# Patient Record
Sex: Female | Born: 1950 | Race: White | Hispanic: No | Smoking: Never smoker
Health system: Southern US, Community
[De-identification: ages and names within clinical notes are randomized; demographics above are authoritative.]

## PROBLEM LIST (undated history)

## (undated) DIAGNOSIS — IMO0001 Reserved for inherently not codable concepts without codable children: Secondary | ICD-10-CM

## (undated) DIAGNOSIS — K279 Peptic ulcer, site unspecified, unspecified as acute or chronic, without hemorrhage or perforation: Secondary | ICD-10-CM

## (undated) DIAGNOSIS — N289 Disorder of kidney and ureter, unspecified: Secondary | ICD-10-CM

## (undated) DIAGNOSIS — A048 Other specified bacterial intestinal infections: Secondary | ICD-10-CM

## (undated) DIAGNOSIS — M1990 Unspecified osteoarthritis, unspecified site: Secondary | ICD-10-CM

## (undated) DIAGNOSIS — K8020 Calculus of gallbladder without cholecystitis without obstruction: Secondary | ICD-10-CM

## (undated) DIAGNOSIS — K219 Gastro-esophageal reflux disease without esophagitis: Secondary | ICD-10-CM

## (undated) HISTORY — PX: LITHOTRIPSY: SUR834

---

## 2005-04-30 ENCOUNTER — Ambulatory Visit (HOSPITAL_COMMUNITY): Admission: RE | Admit: 2005-04-30 | Discharge: 2005-04-30 | Payer: Self-pay | Admitting: Family Medicine

## 2011-07-30 ENCOUNTER — Encounter (HOSPITAL_COMMUNITY): Payer: Self-pay | Admitting: *Deleted

## 2011-07-30 ENCOUNTER — Emergency Department (HOSPITAL_COMMUNITY): Payer: Self-pay

## 2011-07-30 ENCOUNTER — Emergency Department (HOSPITAL_COMMUNITY)
Admission: EM | Admit: 2011-07-30 | Discharge: 2011-07-31 | Disposition: A | Discharge status: Home / Self Care | Payer: Self-pay | Attending: Emergency Medicine | Admitting: Emergency Medicine

## 2011-07-30 DIAGNOSIS — N200 Calculus of kidney: Secondary | ICD-10-CM | POA: Insufficient documentation

## 2011-07-30 DIAGNOSIS — R109 Unspecified abdominal pain: Secondary | ICD-10-CM | POA: Insufficient documentation

## 2011-07-30 DIAGNOSIS — K279 Peptic ulcer, site unspecified, unspecified as acute or chronic, without hemorrhage or perforation: Secondary | ICD-10-CM | POA: Insufficient documentation

## 2011-07-30 DIAGNOSIS — K219 Gastro-esophageal reflux disease without esophagitis: Secondary | ICD-10-CM | POA: Insufficient documentation

## 2011-07-30 HISTORY — DX: Calculus of gallbladder without cholecystitis without obstruction: K80.20

## 2011-07-30 HISTORY — DX: Peptic ulcer, site unspecified, unspecified as acute or chronic, without hemorrhage or perforation: K27.9

## 2011-07-30 HISTORY — DX: Reserved for inherently not codable concepts without codable children: IMO0001

## 2011-07-30 HISTORY — DX: Gastro-esophageal reflux disease without esophagitis: K21.9

## 2011-07-30 LAB — CBC
MCV: 87.4 fL (ref 78.0–100.0)
RDW: 13.5 % (ref 11.5–15.5)
WBC: 9.2 10*3/uL (ref 4.0–10.5)

## 2011-07-30 LAB — COMPREHENSIVE METABOLIC PANEL
AST: 19 U/L (ref 0–37)
Albumin: 3.7 g/dL (ref 3.5–5.2)
Alkaline Phosphatase: 89 U/L (ref 39–117)
CO2: 24 mEq/L (ref 19–32)
Chloride: 101 mEq/L (ref 96–112)
Creatinine, Ser: 1.02 mg/dL (ref 0.50–1.10)
GFR calc Af Amer: 68 mL/min — ABNORMAL LOW (ref >90)
GFR calc non Af Amer: 59 mL/min — ABNORMAL LOW (ref >90)
Glucose, Bld: 172 mg/dL — ABNORMAL HIGH (ref 70–99)
Potassium: 3.5 mEq/L (ref 3.5–5.1)
Sodium: 136 mEq/L (ref 135–145)

## 2011-07-30 LAB — DIFFERENTIAL
Basophils Relative: 0 % (ref 0–1)
Lymphocytes Relative: 17 % (ref 12–46)
Lymphs Abs: 1.6 10*3/uL (ref 0.7–4.0)
Monocytes Relative: 4 % (ref 3–12)
Neutrophils Relative %: 78 % — ABNORMAL HIGH (ref 43–77)

## 2011-07-30 MED ORDER — KETOROLAC TROMETHAMINE 30 MG/ML IJ SOLN
30.0000 mg | Freq: Once | INTRAMUSCULAR | Status: AC
  Administered 2011-07-30: 30 mg via INTRAVENOUS
  Filled 2011-07-30: qty 1

## 2011-07-30 MED ORDER — ONDANSETRON HCL 4 MG/2ML IJ SOLN
4.0000 mg | Freq: Once | INTRAMUSCULAR | Status: AC
  Administered 2011-07-30: 4 mg via INTRAVENOUS
  Filled 2011-07-30: qty 2

## 2011-07-30 MED ORDER — SODIUM CHLORIDE 0.9 % IV BOLUS (SEPSIS)
1000.0000 mL | Freq: Once | INTRAVENOUS | Status: AC
  Administered 2011-07-30: 1000 mL via INTRAVENOUS

## 2011-07-30 MED ORDER — HYDROMORPHONE HCL PF 1 MG/ML IJ SOLN
1.0000 mg | Freq: Once | INTRAMUSCULAR | Status: AC
  Administered 2011-07-30: 1 mg via INTRAVENOUS
  Filled 2011-07-30: qty 1

## 2011-07-30 NOTE — ED Notes (Signed)
Flank pain, vomiting

## 2011-07-30 NOTE — ED Provider Notes (Signed)
History     CSN: [id number]  Arrival date & time 07/30/11  2233   First MD Initiated Contact with Patient 07/30/11 2301      Chief Complaint  Patient presents with  . Flank Pain   HPI The patient presents with left sided back pain.  The pain began acutely 5 hours prior to arrival.  Since onset the pain is constant, sharp, crampy.  The patient's complaint of nausea and emesis.  She denies any diarrhea or dysuria.  She also denies fevers, chills.  She notes that she was in her usual state of health prior to the onset of symptoms.  No relief with OTC medication. Past Medical History  Diagnosis Date  . Reflux   . Peptic ulcer   . Gallstones     History reviewed. No pertinent past surgical history.  History reviewed. No pertinent family history.  History  Substance Use Topics  . Smoking status: Never Smoker   . Smokeless tobacco: Not on file  . Alcohol Use: No    OB History    Grav Para Term Preterm Abortions TAB SAB Ect Mult Living                  Review of Systems  Constitutional:       HPI  HENT:       HPI otherwise negative  Eyes: Negative.   Respiratory:       HPI, otherwise negative  Cardiovascular:       HPI, otherwise nmegative  Gastrointestinal: Positive for nausea and vomiting. Negative for diarrhea and constipation.  Genitourinary:       HPI, otherwise negative  Musculoskeletal:       HPI, otherwise negative  Skin: Negative.   Neurological: Negative for syncope.    Allergies  Avelox  Home Medications   Current Outpatient Rx  Name Route Sig Dispense Refill  . DEXLANSOPRAZOLE 30 MG PO CPDR Oral Take 30 mg by mouth at bedtime.    Marland Kitchen VICOPROFEN PO Oral Take 1 tablet by mouth as needed. For headaches      BP 122/91  Pulse 86  Resp 22  Ht 5\' 2"  (1.575 m)  Wt 250 lb (113.399 kg)  BMI 45.73 kg/m2  SpO2 99%  Physical Exam  Nursing note and vitals reviewed. Constitutional: She is oriented to person, place, and time. She appears well-developed  and well-nourished. No distress.  HENT:  Head: Normocephalic and atraumatic.  Eyes: Conjunctivae and EOM are normal.  Cardiovascular: Normal rate and regular rhythm.   Pulmonary/Chest: Effort normal and breath sounds normal. No stridor. No respiratory distress.  Abdominal: She exhibits no distension. There is no hepatosplenomegaly or hepatomegaly. There is tenderness in the left upper quadrant. There is CVA tenderness. There is no rigidity, no rebound and no guarding.  Musculoskeletal: She exhibits no edema.  Neurological: She is alert and oriented to person, place, and time. No cranial nerve deficit.  Skin: Skin is warm and dry.  Psychiatric: She has a normal mood and affect.    ED Course  Procedures (including critical care time)   Labs Reviewed  CBC  DIFFERENTIAL  COMPREHENSIVE METABOLIC PANEL  LIPASE, BLOOD  URINALYSIS, ROUTINE W REFLEX MICROSCOPIC   No results found.   No diagnosis found.   1:12 AM Patient resting (significantly more comfortable)   MDM  This female patient presents with acute onset of left sided flank pain and nausea with vomiting.  On exam the patient is initially uncomfortable appearing.  The  patient's presentation was most concerning for nephrolithiasis versus pyelonephritis.  Patient's vital signs are stable.  Patient's labs are notable for the absence of leukocytosis. The CT does demonstrate left sided kidney stone with partial obstruction.  Following provision of IV fluids, analgesics, the patient was significantly more comfortable.  Absent fever, leukocytosis the patient was discharged in stable condition to follow up with urology in the morning, 6 hours from now.  She was provided return precautions.  Gerhard Munch, MD 07/31/11 [phone]

## 2011-07-31 ENCOUNTER — Other Ambulatory Visit: Payer: Self-pay | Admitting: Urology

## 2011-07-31 ENCOUNTER — Ambulatory Visit (HOSPITAL_COMMUNITY): Payer: Self-pay

## 2011-07-31 ENCOUNTER — Encounter (HOSPITAL_COMMUNITY): Payer: Self-pay | Admitting: *Deleted

## 2011-07-31 ENCOUNTER — Encounter (HOSPITAL_COMMUNITY): Payer: Self-pay | Admitting: Anesthesiology

## 2011-07-31 ENCOUNTER — Ambulatory Visit (HOSPITAL_COMMUNITY)
Admission: AD | Admit: 2011-07-31 | Discharge: 2011-07-31 | Disposition: A | Discharge status: Home / Self Care | Payer: Self-pay | Source: Ambulatory Visit | Attending: Urology | Admitting: Urology

## 2011-07-31 ENCOUNTER — Ambulatory Visit (HOSPITAL_COMMUNITY): Payer: Self-pay | Admitting: Anesthesiology

## 2011-07-31 ENCOUNTER — Encounter (HOSPITAL_COMMUNITY)
Admission: AD | Disposition: A | Discharge status: Home / Self Care | Payer: Self-pay | Source: Ambulatory Visit | Attending: Urology

## 2011-07-31 DIAGNOSIS — N1330 Unspecified hydronephrosis: Secondary | ICD-10-CM | POA: Insufficient documentation

## 2011-07-31 DIAGNOSIS — N201 Calculus of ureter: Secondary | ICD-10-CM | POA: Insufficient documentation

## 2011-07-31 DIAGNOSIS — E7800 Pure hypercholesterolemia, unspecified: Secondary | ICD-10-CM | POA: Insufficient documentation

## 2011-07-31 HISTORY — DX: Unspecified osteoarthritis, unspecified site: M19.90

## 2011-07-31 LAB — SURGICAL PCR SCREEN: Staphylococcus aureus: NEGATIVE

## 2011-07-31 SURGERY — CYSTOURETEROSCOPY, WITH RETROGRADE PYELOGRAM AND STENT INSERTION
Anesthesia: General | Site: Ureter | Laterality: Left | Wound class: Clean Contaminated

## 2011-07-31 MED ORDER — INDIGOTINDISULFONATE SODIUM 8 MG/ML IJ SOLN
INTRAMUSCULAR | Status: AC
  Filled 2011-07-31: qty 5

## 2011-07-31 MED ORDER — MIDAZOLAM HCL 5 MG/5ML IJ SOLN
INTRAMUSCULAR | Status: DC | PRN
  Administered 2011-07-31: 2 mg via INTRAVENOUS

## 2011-07-31 MED ORDER — PROPOFOL 10 MG/ML IV EMUL
INTRAVENOUS | Status: DC | PRN
  Administered 2011-07-31: 160 mg via INTRAVENOUS

## 2011-07-31 MED ORDER — LIDOCAINE HCL 2 % EX GEL
CUTANEOUS | Status: AC
  Filled 2011-07-31: qty 10

## 2011-07-31 MED ORDER — IOHEXOL 300 MG/ML  SOLN
INTRAMUSCULAR | Status: AC
  Filled 2011-07-31: qty 1

## 2011-07-31 MED ORDER — LACTATED RINGERS IV SOLN: INTRAVENOUS | Status: DC

## 2011-07-31 MED ORDER — IOHEXOL 300 MG/ML  SOLN
INTRAMUSCULAR | Status: DC | PRN
  Administered 2011-07-31: 9 mL

## 2011-07-31 MED ORDER — BELLADONNA ALKALOIDS-OPIUM 16.2-60 MG RE SUPP
RECTAL | Status: AC
  Filled 2011-07-31: qty 1

## 2011-07-31 MED ORDER — CEFAZOLIN SODIUM 1-5 GM-% IV SOLN
INTRAVENOUS | Status: AC
  Filled 2011-07-31: qty 50

## 2011-07-31 MED ORDER — CEFAZOLIN SODIUM 1-5 GM-% IV SOLN
1.0000 g | INTRAVENOUS | Status: AC
  Administered 2011-07-31: 1 g via INTRAVENOUS

## 2011-07-31 MED ORDER — HYDROMORPHONE HCL PF 1 MG/ML IJ SOLN: 0.2500 mg | INTRAMUSCULAR | Status: DC | PRN

## 2011-07-31 MED ORDER — LACTATED RINGERS IV SOLN
INTRAVENOUS | Status: DC | PRN
  Administered 2011-07-31: 18:00:00 via INTRAVENOUS

## 2011-07-31 MED ORDER — CEPHALEXIN 250 MG PO CAPS: 250.0000 mg | ORAL_CAPSULE | Freq: Four times a day (QID) | ORAL | Status: AC

## 2011-07-31 MED ORDER — HYDROCODONE-ACETAMINOPHEN 5-325 MG PO TABS: 1.0000 | ORAL_TABLET | Freq: Four times a day (QID) | ORAL | Status: AC | PRN

## 2011-07-31 MED ORDER — HYDROMORPHONE HCL PF 1 MG/ML IJ SOLN
1.0000 mg | Freq: Once | INTRAMUSCULAR | Status: AC
  Administered 2011-07-31: 1 mg via INTRAVENOUS
  Filled 2011-07-31: qty 1

## 2011-07-31 MED ORDER — MEPERIDINE HCL 50 MG/ML IJ SOLN: 6.2500 mg | INTRAMUSCULAR | Status: DC | PRN

## 2011-07-31 MED ORDER — PROMETHAZINE HCL 25 MG/ML IJ SOLN: 6.2500 mg | INTRAMUSCULAR | Status: DC | PRN

## 2011-07-31 MED ORDER — MUPIROCIN 2 % EX OINT
TOPICAL_OINTMENT | CUTANEOUS | Status: AC
  Filled 2011-07-31: qty 22

## 2011-07-31 MED ORDER — FENTANYL CITRATE 0.05 MG/ML IJ SOLN
INTRAMUSCULAR | Status: DC | PRN
  Administered 2011-07-31: 50 ug via INTRAVENOUS

## 2011-07-31 SURGICAL SUPPLY — 24 items
ADAPTER CATH URET PLST 4-6FR (CATHETERS) ×2 IMPLANT
ADPR CATH URET STRL DISP 4-6FR (CATHETERS) ×1
BAG URO CATCHER STRL LF (DRAPE) ×2 IMPLANT
BASKET ZERO TIP NITINOL 2.4FR (BASKET) ×1 IMPLANT
BSKT STON RTRVL ZERO TP 2.4FR (BASKET) ×1
CATH INTERMIT  6FR 70CM (CATHETERS) IMPLANT
CATH URET 5FR 28IN CONE TIP (BALLOONS)
CATH URET 5FR 28IN OPEN ENDED (CATHETERS) ×2 IMPLANT
CATH URET 5FR 70CM CONE TIP (BALLOONS) IMPLANT
CLOTH BEACON ORANGE TIMEOUT ST (SAFETY) ×2 IMPLANT
DRAPE CAMERA CLOSED 9X96 (DRAPES) ×2 IMPLANT
GLOVE BIOGEL M 7.0 STRL (GLOVE) ×2 IMPLANT
GLOVE SURG SS PI 8.0 STRL IVOR (GLOVE) ×2 IMPLANT
GOWN PREVENTION PLUS XLARGE (GOWN DISPOSABLE) ×2 IMPLANT
GOWN STRL NON-REIN LRG LVL3 (GOWN DISPOSABLE) ×4 IMPLANT
GOWN STRL REIN XL XLG (GOWN DISPOSABLE) ×2 IMPLANT
LASER FIBER DISP (UROLOGICAL SUPPLIES) ×1 IMPLANT
MANIFOLD NEPTUNE II (INSTRUMENTS) ×2 IMPLANT
MARKER SKIN DUAL TIP RULER LAB (MISCELLANEOUS) ×2 IMPLANT
NS IRRIG 1000ML POUR BTL (IV SOLUTION) ×2 IMPLANT
PACK CYSTO (CUSTOM PROCEDURE TRAY) ×2 IMPLANT
STENT CONTOUR 6FRX24X.038 (STENTS) ×1 IMPLANT
TUBING CONNECTING 10 (TUBING) ×2 IMPLANT
WIRE COONS/BENSON .038X145CM (WIRE) ×2 IMPLANT

## 2011-07-31 NOTE — H&P (Signed)
History of Present Illness  Kaitlyn Williamson was seen in the ER last night at [facility] with sudden onset of severe left flank pain radiating to the left lower quadrant associated with nausea and vomiting.  CT scan revealed moderate to high grade left ureteral obstruction secondary to 2 stones in the left distal ureter.  The stones measure in total 7 mm.  She does not have a history of kidney stone.  She denies frequency, urgency, dysuria or hematuria.  She is referred today by Dr Renard Matter for further evaluation and treatment.  She is still complaining of severe left flank pain, nausea and vomiting.   Past Medical History Problems  1. History of  Arthritis V13.4 2. History of  Gastric Ulcer 531.90 3. History of  Hypercholesterolemia 272.0  Surgical History Problems  1. History of  No Surgical Problems  Current Meds 1. Dexilant 60 MG Oral Capsule Delayed Release; Therapy: (Recorded:05Apr2013) to 2. Skelaxin 400 MG TABS; Therapy: (Recorded:05Apr2013) to  Allergies Medication  1. Avelox TABS  Family History Problems  1. Maternal history of  Blood In Urine 2. Paternal history of  Death In The Family Father 3. Maternal history of  Death In The Family Mother 4. Paternal aunt's history of  Diabetes Mellitus V18.0 5. Family history of  Family Health Status Number Of Children 6. Paternal history of  Heart Disease V17.49 7. Maternal history of  Heart Disease V17.49 8. Paternal history of  Hypothyroidism 9. Maternal history of  Hypothyroidism 10. Paternal grandfather's history of  Leukemia V16.6 11. Paternal aunt's history of  Leukemia V16.6 12. Maternal history of  Lung Cancer V16.1 13. Sororal history of  Nephrolithiasis 14. Paternal history of  Renal Failure 15. Paternal history of  Skin Cancer V16.8 16. Paternal history of  Stroke Syndrome V17.1 17. Maternal history of  Tuberculosis  Social History Problems    Caffeine Use   Marital History - Single   Never A Smoker    Occupation: Denied    History of  Alcohol Use   History of  Tobacco Use  Review of Systems Genitourinary, constitutional, skin, eye, otolaryngeal, hematologic/lymphatic, cardiovascular, pulmonary, endocrine, musculoskeletal, gastrointestinal, neurological and psychiatric system(s) were reviewed and pertinent findings if present are noted.  Genitourinary: nocturia and incontinence.  Gastrointestinal: nausea, vomiting and abdominal pain.  Constitutional: feeling tired (fatigue).  Respiratory: cough.  Musculoskeletal: back pain and joint pain.    Vitals Vital Signs [Data Includes: Last 1 Day]  05Apr2013 04:28PM  BMI Calculated: 49.32 BSA Calculated: 2.16 Height: 5 ft 2 in Weight: 268 lb  Blood Pressure: 140 / 84 Temperature: 98.5 F Heart Rate: 105  Physical Exam Constitutional: Well nourished and well developed . No acute distress.  ENT:. The ears and nose are normal in appearance.  Neck: The appearance of the neck is normal and no neck mass is present.  Pulmonary: No respiratory distress and normal respiratory rhythm and effort.  Cardiovascular: Heart rate and rhythm are normal . No peripheral edema.  Abdomen: The abdomen is obese. The abdomen is soft and nontender. No masses are palpated. Severe tenderness in the LUQ is present. severe left CVA tenderness no CVA tenderness. No hernias are palpable. No hepatosplenomegaly noted.  Genitourinary: Examination of the external genitalia shows normal female external genitalia. The urethra is normal in appearance. No cystocele is identified. The cervix is is without abnormalities. The uterus is without abnormalities.  Lymphatics: The femoral and inguinal nodes are not enlarged or tender.  Skin: Normal skin turgor,  no visible rash and no visible skin lesions.  Neuro/Psych:. Mood and affect are appropriate.    Results/Data Urine [Data Includes: Last 1 Day]   05Apr2013  COLOR YELLOW   APPEARANCE CLEAR   SPECIFIC GRAVITY 1.025   pH 6.0    GLUCOSE NEG mg/dL  BILIRUBIN NEG   KETONE NEG mg/dL  BLOOD LARGE   PROTEIN TRACE mg/dL  UROBILINOGEN 0.2 mg/dL  NITRITE NEG   LEUKOCYTE ESTERASE SMALL   SQUAMOUS EPITHELIAL/HPF FEW   WBC 0-3 WBC/hpf  RBC 0-3 RBC/hpf  BACTERIA FEW   CRYSTALS NONE SEEN   CASTS NONE SEEN     I independently reviewed the CT scan and the findings are as noted in the HPI.   Assessment Assessed  1. Distal Ureteral Stone On The Left 592.1 2. Hydronephrosis 591 3. Nephrolithiasis Of The Left Kidney 592.0  Plan Health Maintenance (V70.0)  1. UA With REFLEX  Done: 05Apr2013 04:11PM   Cystoscopy, left retrograde pyelogram, ureteroscopy, holmium laser, JJ stent.  The procedure, risks, bebefits were explained to the patient.  The risks include but are not limited to hemorrhage, infection, ureteral injury, inability to remove the stone.   Signatures  CC: Dr Renard Matter  Electronically signed by : Su Grand, M.D.; Jul 31 2011  5:13PM

## 2011-07-31 NOTE — Discharge Instructions (Signed)
Your evaluation tonight demonstrated to have kidney stones on the left side.  It is very important that you follow up as directed this morning with urology.  Please make sure to call as soon as possible to arrange followup care.  If you develop any new, or concerning changes in your condition, such as fever, persistent nausea, worsening pain, please return to the emergency department immediately.

## 2011-07-31 NOTE — Op Note (Signed)
Kaitlyn Williamson is a 61 y.o.   07/31/2011  Pre-op diagnosis: Left ureteral calculi, left hydronephrosis  Postop diagnosis: Same  Procedure done: Cystoscopy, left retrograde pyelogram, ureteroscopy with holmium laser of ureteral calculi, double-J stent  Surgeon: Wendie Simmer. [name]  Anesthesia: General  Indication: Patient is a 61 years old female who was seen in the emergency room at [facility] with sudden onset of severe left flank pain associated with nausea and vomiting. CT scan showed a nonobstructing left renal calculus, moderately severe left hydronephrosis secondary to two calculi in the distal ureter. The stones measure in total 7 mm. She was treated with analgesics and sent home. She continued to have severe pain and was referred to our office today for further evaluation and treatment. Because of her symptoms and the CT scan findings she was advised to have cystoscopy, stone manipulation. The procedure, risks, benefits were discussed with the patient. The risks include but are not limited to hemorrhage, infection, ureteral injury, inability to extract the stone. She understands and gave informed consent.  Procedure: The patient was identified by her wrist band and proper timeout was taken.  Under general anesthesia she was prepped and draped and placed in the dorsolithotomy position. A panendoscope was inserted in the bladder. The bladder mucosa is normal. There is no stone or tumor in the bladder. The ureteral orifices are in normal position and shape.  Retrograde pyelogram:  A cone-tip catheter was passed through the cystoscope and the left ureteral orifice. 5 cc of Omnipaque were instilled through the cone-tip catheter. The distal ureter is normal. There is a filling defect in the distal ureter consistent with the known ureteral stone. The ureter proximal to the filling defect is moderately dilated. The calyces are also moderately dilated. The cone-tip catheter was removed.  A  sensor wire was passed through the cystoscope and the left ureter up to the renal pelvis. The bladder was then emptied and the cystoscope removed.  A semirigid ureteroscope was then passed through the ureter. The stones were visualized in the distal ureter. With a 365 microfiber holmium laser the stone was fragmented in 7 smaller fragments. All stone fragments were removed with the Nitinol basket.  Second retrograde pyelogram:  With the ureteroscope in the distal ureter, contrast was injected through the ureteroscope. There is no filling defect in the ureter. There is no extravasation of contrast. The ureteroscope was then removed. The sensor wire was then backloaded into the cystoscope. A #6-24 double-J stent was passed over the sensor wire. The sensor wire was then removed. The proximal curl of the double-J stent is in the renal pelvis. The distal curl is in the bladder. The bladder was then emptied and the cystoscope removed.  The patient tolerated the procedure well and left the OR in satisfactory condition to postanesthesia care unit.  EBL: None

## 2011-07-31 NOTE — Anesthesia Postprocedure Evaluation (Signed)
  Anesthesia Post-op Note  Patient: Kaitlyn Williamson  Procedure(s) Performed: Procedure(s) (LRB): CYSTOSCOPY WITH RETROGRADE PYELOGRAM, URETEROSCOPY AND STENT PLACEMENT (Left) HOLMIUM LASER APPLICATION (Left)  Patient Location: PACU  Anesthesia Type: General  Level of Consciousness: awake and alert   Airway and Oxygen Therapy: Patient Spontanous Breathing  Post-op Pain: mild  Post-op Assessment: Post-op Vital signs reviewed, Patient's Cardiovascular Status Stable, Respiratory Function Stable, Patent Airway and No signs of Nausea or vomiting  Post-op Vital Signs: stable  Complications: No apparent anesthesia complications

## 2011-07-31 NOTE — Anesthesia Preprocedure Evaluation (Addendum)
Anesthesia Evaluation  Patient identified by MRN, date of birth, ID band Patient awake    Reviewed: Allergy & Precautions, H&P , NPO status , Patient's Chart, lab work & pertinent test results  Airway Mallampati: II      Dental   Pulmonary neg pulmonary ROS,          Cardiovascular negative cardio ROS      Neuro/Psych negative neurological ROS  negative psych ROS   GI/Hepatic negative GI ROS, Neg liver ROS, PUD,   Endo/Other  negative endocrine ROSMorbid obesity  Renal/GU negative Renal ROS  negative genitourinary   Musculoskeletal negative musculoskeletal ROS (+)   Abdominal   Peds negative pediatric ROS (+)  Hematology negative hematology ROS (+)   Anesthesia Other Findings Upper front caps  Reproductive/Obstetrics negative OB ROS                           Anesthesia Physical Anesthesia Plan  ASA: II  Anesthesia Plan: General   Post-op Pain Management:    Induction: Intravenous  Airway Management Planned: LMA  Additional Equipment:   Intra-op Plan:   Post-operative Plan: Extubation in OR  Informed Consent: I have reviewed the patients History and Physical, chart, labs and discussed the procedure including the risks, benefits and alternatives for the proposed anesthesia with the patient or authorized representative who has indicated his/her understanding and acceptance.   Dental advisory given  Plan Discussed with: CRNA  Anesthesia Plan Comments:         Anesthesia Quick Evaluation

## 2011-07-31 NOTE — Transfer of Care (Signed)
Immediate Anesthesia Transfer of Care Note  Patient: Kaitlyn Williamson  Procedure(s) Performed: Procedure(s) (LRB): CYSTOSCOPY WITH RETROGRADE PYELOGRAM, URETEROSCOPY AND STENT PLACEMENT (Left) HOLMIUM LASER APPLICATION (Left)  Patient Location: PACU  Anesthesia Type: General  Level of Consciousness: awake, sedated and patient cooperative  Airway & Oxygen Therapy: Patient Spontanous Breathing and Patient connected to face mask oxygen  Post-op Assessment: Report given to PACU RN and Post -op Vital signs reviewed and stable  Post vital signs: Reviewed and stable  Complications: No apparent anesthesia complications

## 2011-08-02 LAB — URINE CULTURE: Culture  Setup Time: 201304060128

## 2011-08-18 MED FILL — Lidocaine HCl Gel 2%: CUTANEOUS | Qty: 10 | Status: AC

## 2014-03-23 ENCOUNTER — Encounter (HOSPITAL_COMMUNITY): Payer: Self-pay | Admitting: Emergency Medicine

## 2014-03-23 ENCOUNTER — Encounter (HOSPITAL_COMMUNITY)
Admission: EM | Disposition: A | Discharge status: Home / Self Care | Payer: Self-pay | Source: Home / Self Care | Attending: General Surgery

## 2014-03-23 ENCOUNTER — Emergency Department (HOSPITAL_COMMUNITY): Payer: Self-pay

## 2014-03-23 ENCOUNTER — Emergency Department (HOSPITAL_COMMUNITY): Payer: Self-pay | Admitting: Anesthesiology

## 2014-03-23 ENCOUNTER — Inpatient Hospital Stay (HOSPITAL_COMMUNITY)
Admission: EM | Admit: 2014-03-23 | Discharge: 2014-03-25 | DRG: 354 | Disposition: A | Discharge status: Home / Self Care | Payer: Self-pay | Attending: General Surgery | Admitting: General Surgery

## 2014-03-23 DIAGNOSIS — R1110 Vomiting, unspecified: Secondary | ICD-10-CM

## 2014-03-23 DIAGNOSIS — Z09 Encounter for follow-up examination after completed treatment for conditions other than malignant neoplasm: Secondary | ICD-10-CM

## 2014-03-23 DIAGNOSIS — R1084 Generalized abdominal pain: Secondary | ICD-10-CM

## 2014-03-23 DIAGNOSIS — Z888 Allergy status to other drugs, medicaments and biological substances status: Secondary | ICD-10-CM

## 2014-03-23 DIAGNOSIS — Z6841 Body Mass Index (BMI) 40.0 and over, adult: Secondary | ICD-10-CM

## 2014-03-23 DIAGNOSIS — K439 Ventral hernia without obstruction or gangrene: Secondary | ICD-10-CM

## 2014-03-23 DIAGNOSIS — R109 Unspecified abdominal pain: Secondary | ICD-10-CM

## 2014-03-23 DIAGNOSIS — K219 Gastro-esophageal reflux disease without esophagitis: Secondary | ICD-10-CM | POA: Diagnosis present

## 2014-03-23 DIAGNOSIS — K436 Other and unspecified ventral hernia with obstruction, without gangrene: Principal | ICD-10-CM | POA: Diagnosis present

## 2014-03-23 DIAGNOSIS — M1990 Unspecified osteoarthritis, unspecified site: Secondary | ICD-10-CM | POA: Diagnosis present

## 2014-03-23 HISTORY — PX: INSERTION OF MESH: SHX5868

## 2014-03-23 HISTORY — PX: VENTRAL HERNIA REPAIR: SHX424

## 2014-03-23 HISTORY — DX: Disorder of kidney and ureter, unspecified: N28.9

## 2014-03-23 HISTORY — DX: Other specified bacterial intestinal infections: A04.8

## 2014-03-23 LAB — CBC WITH DIFFERENTIAL/PLATELET
BASOS ABS: 0 10*3/uL (ref 0.0–0.1)
Basophils Relative: 0 % (ref 0–1)
EOS PCT: 0 % (ref 0–5)
Eosinophils Absolute: 0 10*3/uL (ref 0.0–0.7)
HEMATOCRIT: 45.4 % (ref 36.0–46.0)
Hemoglobin: 15.4 g/dL — ABNORMAL HIGH (ref 12.0–15.0)
LYMPHS ABS: 2.2 10*3/uL (ref 0.7–4.0)
Lymphocytes Relative: 18 % (ref 12–46)
MCH: 30.4 pg (ref 26.0–34.0)
MCHC: 33.9 g/dL (ref 30.0–36.0)
MCV: 89.5 fL (ref 78.0–100.0)
MONO ABS: 1 10*3/uL (ref 0.1–1.0)
Monocytes Relative: 8 % (ref 3–12)
Neutro Abs: 9.2 10*3/uL — ABNORMAL HIGH (ref 1.7–7.7)
Neutrophils Relative %: 74 % (ref 43–77)
Platelets: 291 10*3/uL (ref 150–400)
RBC: 5.07 MIL/uL (ref 3.87–5.11)
RDW: 13.8 % (ref 11.5–15.5)
WBC: 12.3 10*3/uL — AB (ref 4.0–10.5)

## 2014-03-23 LAB — LIPASE, BLOOD: LIPASE: 13 U/L (ref 11–59)

## 2014-03-23 LAB — TROPONIN I: Troponin I: <0.3 ng/mL (ref <0.30)

## 2014-03-23 LAB — COMPREHENSIVE METABOLIC PANEL
ALT: 21 U/L (ref 0–35)
AST: 20 U/L (ref 0–37)
Albumin: 3.9 g/dL (ref 3.5–5.2)
Alkaline Phosphatase: 71 U/L (ref 39–117)
Anion gap: 14 (ref 5–15)
BUN: 18 mg/dL (ref 6–23)
CALCIUM: 9.5 mg/dL (ref 8.4–10.5)
CO2: 25 meq/L (ref 19–32)
Chloride: 99 mEq/L (ref 96–112)
Creatinine, Ser: 0.82 mg/dL (ref 0.50–1.10)
GFR, EST AFRICAN AMERICAN: 88 mL/min — AB (ref >90)
GFR, EST NON AFRICAN AMERICAN: 76 mL/min — AB (ref >90)
GLUCOSE: 118 mg/dL — AB (ref 70–99)
Potassium: 3.9 mEq/L (ref 3.7–5.3)
Sodium: 138 mEq/L (ref 137–147)
Total Bilirubin: 0.5 mg/dL (ref 0.3–1.2)
Total Protein: 8.2 g/dL (ref 6.0–8.3)

## 2014-03-23 LAB — URINALYSIS, ROUTINE W REFLEX MICROSCOPIC
Bilirubin Urine: NEGATIVE
Glucose, UA: NEGATIVE mg/dL
KETONES UR: NEGATIVE mg/dL
LEUKOCYTES UA: NEGATIVE
NITRITE: NEGATIVE
PROTEIN: NEGATIVE mg/dL
Specific Gravity, Urine: 1.015 (ref 1.005–1.030)
Urobilinogen, UA: 0.2 mg/dL (ref 0.0–1.0)
pH: 5.5 (ref 5.0–8.0)

## 2014-03-23 LAB — URINE MICROSCOPIC-ADD ON

## 2014-03-23 SURGERY — REPAIR, HERNIA, VENTRAL
Anesthesia: General

## 2014-03-23 MED ORDER — LIDOCAINE HCL (PF) 1 % IJ SOLN
INTRAMUSCULAR | Status: AC
  Filled 2014-03-23: qty 5

## 2014-03-23 MED ORDER — SODIUM CHLORIDE 0.9 % IR SOLN
Status: DC | PRN
  Administered 2014-03-23: 1000 mL

## 2014-03-23 MED ORDER — LORAZEPAM 2 MG/ML IJ SOLN: 1.0000 mg | INTRAMUSCULAR | Status: DC | PRN

## 2014-03-23 MED ORDER — LACTATED RINGERS IV SOLN
INTRAVENOUS | Status: DC | PRN
  Administered 2014-03-23: 16:00:00 via INTRAVENOUS

## 2014-03-23 MED ORDER — ROCURONIUM BROMIDE 100 MG/10ML IV SOLN
INTRAVENOUS | Status: DC | PRN
  Administered 2014-03-23: 5 mg via INTRAVENOUS
  Administered 2014-03-23: 25 mg via INTRAVENOUS

## 2014-03-23 MED ORDER — ONDANSETRON HCL 4 MG/2ML IJ SOLN
4.0000 mg | Freq: Once | INTRAMUSCULAR | Status: AC
  Administered 2014-03-23: 4 mg via INTRAVENOUS

## 2014-03-23 MED ORDER — NEOSTIGMINE METHYLSULFATE 10 MG/10ML IV SOLN
INTRAVENOUS | Status: DC | PRN
  Administered 2014-03-23: 3 mg via INTRAVENOUS

## 2014-03-23 MED ORDER — ALUM & MAG HYDROXIDE-SIMETH 200-200-20 MG/5ML PO SUSP: 30.0000 mL | ORAL | Status: DC | PRN

## 2014-03-23 MED ORDER — NEOSTIGMINE METHYLSULFATE 10 MG/10ML IV SOLN
INTRAVENOUS | Status: AC
  Filled 2014-03-23: qty 1

## 2014-03-23 MED ORDER — CEFAZOLIN SODIUM 1-5 GM-% IV SOLN
1.0000 g | Freq: Once | INTRAVENOUS | Status: AC
  Administered 2014-03-23: 1 g via INTRAVENOUS
  Filled 2014-03-23: qty 50

## 2014-03-23 MED ORDER — PANTOPRAZOLE SODIUM 40 MG PO TBEC
40.0000 mg | DELAYED_RELEASE_TABLET | Freq: Every day | ORAL | Status: DC
  Administered 2014-03-24: 40 mg via ORAL
  Filled 2014-03-23 (×2): qty 1

## 2014-03-23 MED ORDER — FENTANYL CITRATE 0.05 MG/ML IJ SOLN
INTRAMUSCULAR | Status: DC | PRN
  Administered 2014-03-23 (×5): 50 ug via INTRAVENOUS

## 2014-03-23 MED ORDER — ONDANSETRON HCL 4 MG/2ML IJ SOLN: 4.0000 mg | Freq: Four times a day (QID) | INTRAMUSCULAR | Status: DC | PRN

## 2014-03-23 MED ORDER — FENTANYL CITRATE 0.05 MG/ML IJ SOLN
100.0000 ug | Freq: Once | INTRAMUSCULAR | Status: AC
  Administered 2014-03-23: 100 ug via INTRAVENOUS
  Filled 2014-03-23: qty 2

## 2014-03-23 MED ORDER — POVIDONE-IODINE 10 % EX OINT
TOPICAL_OINTMENT | CUTANEOUS | Status: AC
  Filled 2014-03-23: qty 1

## 2014-03-23 MED ORDER — POVIDONE-IODINE 10 % OINT PACKET
TOPICAL_OINTMENT | CUTANEOUS | Status: DC | PRN
  Administered 2014-03-23: 1 via TOPICAL

## 2014-03-23 MED ORDER — ARTIFICIAL TEARS OP OINT
TOPICAL_OINTMENT | OPHTHALMIC | Status: AC
  Filled 2014-03-23: qty 7

## 2014-03-23 MED ORDER — OXYCODONE-ACETAMINOPHEN 5-325 MG PO TABS
1.0000 | ORAL_TABLET | ORAL | Status: DC | PRN
  Administered 2014-03-23 – 2014-03-25 (×4): 1 via ORAL
  Filled 2014-03-23 (×4): qty 1

## 2014-03-23 MED ORDER — ONDANSETRON HCL 4 MG/2ML IJ SOLN
4.0000 mg | Freq: Once | INTRAMUSCULAR | Status: AC
  Administered 2014-03-23 (×2): 4 mg via INTRAVENOUS
  Filled 2014-03-23: qty 2

## 2014-03-23 MED ORDER — SUCCINYLCHOLINE CHLORIDE 20 MG/ML IJ SOLN
INTRAMUSCULAR | Status: AC
  Filled 2014-03-23: qty 1

## 2014-03-23 MED ORDER — HYDROMORPHONE HCL 1 MG/ML IJ SOLN: 1.0000 mg | INTRAMUSCULAR | Status: DC | PRN

## 2014-03-23 MED ORDER — GLYCOPYRROLATE 0.2 MG/ML IJ SOLN
INTRAMUSCULAR | Status: DC | PRN
  Administered 2014-03-23: 0.6 mg via INTRAVENOUS

## 2014-03-23 MED ORDER — BUPIVACAINE HCL (PF) 0.5 % IJ SOLN
INTRAMUSCULAR | Status: AC
  Filled 2014-03-23: qty 30

## 2014-03-23 MED ORDER — IOHEXOL 300 MG/ML  SOLN
100.0000 mL | Freq: Once | INTRAMUSCULAR | Status: AC | PRN
  Administered 2014-03-23: 100 mL via INTRAVENOUS

## 2014-03-23 MED ORDER — CEFAZOLIN SODIUM 1-5 GM-% IV SOLN
INTRAVENOUS | Status: AC
  Filled 2014-03-23: qty 50

## 2014-03-23 MED ORDER — PROPOFOL 10 MG/ML IV BOLUS
INTRAVENOUS | Status: DC | PRN
  Administered 2014-03-23: 130 mg via INTRAVENOUS

## 2014-03-23 MED ORDER — ENOXAPARIN SODIUM 40 MG/0.4ML ~~LOC~~ SOLN
40.0000 mg | SUBCUTANEOUS | Status: DC
  Administered 2014-03-24 – 2014-03-25 (×2): 40 mg via SUBCUTANEOUS
  Filled 2014-03-23 (×2): qty 0.4

## 2014-03-23 MED ORDER — SUCCINYLCHOLINE CHLORIDE 20 MG/ML IJ SOLN
INTRAMUSCULAR | Status: DC | PRN
  Administered 2014-03-23: 140 mg via INTRAVENOUS

## 2014-03-23 MED ORDER — IOHEXOL 300 MG/ML  SOLN
50.0000 mL | Freq: Once | INTRAMUSCULAR | Status: AC | PRN
  Administered 2014-03-23: 50 mL via ORAL

## 2014-03-23 MED ORDER — BUPIVACAINE HCL (PF) 0.5 % IJ SOLN
INTRAMUSCULAR | Status: DC | PRN
  Administered 2014-03-23: 10 mL

## 2014-03-23 MED ORDER — MIDAZOLAM HCL 2 MG/2ML IJ SOLN
INTRAMUSCULAR | Status: AC
  Filled 2014-03-23: qty 2

## 2014-03-23 MED ORDER — GLYCOPYRROLATE 0.2 MG/ML IJ SOLN
INTRAMUSCULAR | Status: AC
  Filled 2014-03-23: qty 3

## 2014-03-23 MED ORDER — ONDANSETRON HCL 4 MG/2ML IJ SOLN
INTRAMUSCULAR | Status: AC
  Administered 2014-03-23: 4 mg via INTRAVENOUS
  Filled 2014-03-23: qty 2

## 2014-03-23 MED ORDER — HYDROMORPHONE HCL 1 MG/ML IJ SOLN
1.0000 mg | Freq: Once | INTRAMUSCULAR | Status: AC
  Administered 2014-03-23: 1 mg via INTRAVENOUS
  Filled 2014-03-23: qty 1

## 2014-03-23 MED ORDER — LIDOCAINE HCL (CARDIAC) 10 MG/ML IV SOLN
INTRAVENOUS | Status: DC | PRN
Start: 2014-03-23 — End: 2014-03-23
  Administered 2014-03-23: 20 mg via INTRAVENOUS

## 2014-03-23 MED ORDER — ROCURONIUM BROMIDE 50 MG/5ML IV SOLN
INTRAVENOUS | Status: AC
  Filled 2014-03-23: qty 1

## 2014-03-23 MED ORDER — SODIUM CHLORIDE 0.9 % IV BOLUS (SEPSIS)
2000.0000 mL | Freq: Once | INTRAVENOUS | Status: AC
  Administered 2014-03-23: 2000 mL via INTRAVENOUS

## 2014-03-23 MED ORDER — KETOROLAC TROMETHAMINE 30 MG/ML IJ SOLN
INTRAMUSCULAR | Status: AC
  Filled 2014-03-23: qty 1

## 2014-03-23 MED ORDER — FENTANYL CITRATE 0.05 MG/ML IJ SOLN
INTRAMUSCULAR | Status: AC
  Filled 2014-03-23: qty 5

## 2014-03-23 MED ORDER — KETOROLAC TROMETHAMINE 30 MG/ML IJ SOLN
30.0000 mg | Freq: Once | INTRAMUSCULAR | Status: AC
  Administered 2014-03-23: 30 mg via INTRAVENOUS

## 2014-03-23 MED ORDER — CEFAZOLIN SODIUM 1-5 GM-% IV SOLN
1.0000 g | Freq: Once | INTRAVENOUS | Status: AC
  Administered 2014-03-23: 1 g via INTRAVENOUS

## 2014-03-23 MED ORDER — ONDANSETRON HCL 4 MG PO TABS: 4.0000 mg | ORAL_TABLET | Freq: Four times a day (QID) | ORAL | Status: DC | PRN

## 2014-03-23 MED ORDER — MIDAZOLAM HCL 2 MG/2ML IJ SOLN
INTRAMUSCULAR | Status: DC | PRN
  Administered 2014-03-23: 2 mg via INTRAVENOUS

## 2014-03-23 MED ORDER — LACTATED RINGERS IV SOLN
INTRAVENOUS | Status: DC
  Administered 2014-03-23 – 2014-03-24 (×3): via INTRAVENOUS

## 2014-03-23 SURGICAL SUPPLY — 29 items
BAG HAMPER (MISCELLANEOUS) ×4 IMPLANT
CHLORAPREP W/TINT 26ML (MISCELLANEOUS) ×4 IMPLANT
CLOTH BEACON ORANGE TIMEOUT ST (SAFETY) ×4 IMPLANT
COVER LIGHT HANDLE STERIS (MISCELLANEOUS) ×8 IMPLANT
DECANTER SPIKE VIAL GLASS SM (MISCELLANEOUS) ×3 IMPLANT
GAUZE SPONGE 4X4 12PLY STRL (GAUZE/BANDAGES/DRESSINGS) ×3 IMPLANT
GLOVE BIOGEL PI IND STRL 7.0 (GLOVE) ×2 IMPLANT
GLOVE BIOGEL PI INDICATOR 7.0 (GLOVE) ×4
GLOVE SURG SS PI 7.0 STRL IVOR (GLOVE) ×3 IMPLANT
GLOVE SURG SS PI 7.5 STRL IVOR (GLOVE) ×12 IMPLANT
GOWN STRL REUS W/ TWL LRG LVL3 (GOWN DISPOSABLE) ×2 IMPLANT
GOWN STRL REUS W/TWL LRG LVL3 (GOWN DISPOSABLE) ×14 IMPLANT
INST SET MAJOR GENERAL (KITS) ×4 IMPLANT
KIT ROOM TURNOVER APOR (KITS) ×4 IMPLANT
LIGASURE IMPACT 36 18CM CVD LR (INSTRUMENTS) ×3 IMPLANT
MANIFOLD NEPTUNE II (INSTRUMENTS) ×4 IMPLANT
MESH VENTRALEX ST 8CM LRG (Mesh General) ×3 IMPLANT
NDL HYPO 25X1 1.5 SAFETY (NEEDLE) IMPLANT
NEEDLE HYPO 25X1 1.5 SAFETY (NEEDLE) ×4 IMPLANT
NS IRRIG 1000ML POUR BTL (IV SOLUTION) ×4 IMPLANT
PACK ABDOMINAL MAJOR (CUSTOM PROCEDURE TRAY) ×4 IMPLANT
PAD ARMBOARD 7.5X6 YLW CONV (MISCELLANEOUS) ×4 IMPLANT
SET BASIN LINEN APH (SET/KITS/TRAYS/PACK) ×4 IMPLANT
SPONGE GAUZE 4X4 12PLY (GAUZE/BANDAGES/DRESSINGS) ×3 IMPLANT
STAPLER VISISTAT (STAPLE) ×4 IMPLANT
SUT ETHIBOND NAB MO 7 #0 18IN (SUTURE) ×3 IMPLANT
SUT VIC AB 2-0 CT2 27 (SUTURE) ×3 IMPLANT
SYR CONTROL 10ML LL (SYRINGE) ×3 IMPLANT
TAPE CLOTH SURG 4X10 WHT LF (GAUZE/BANDAGES/DRESSINGS) ×3 IMPLANT

## 2014-03-23 NOTE — Anesthesia Postprocedure Evaluation (Signed)
  Anesthesia Post-op Note  Patient: Kaitlyn Williamson  Procedure(s) Performed: Procedure(s): VENTRAL HERNIORRHAPHY INSERTION OF MESH  Patient Location: PACU  Anesthesia Type:General  Level of Consciousness: sedated and patient cooperative  Airway and Oxygen Therapy: Patient Spontanous Breathing and non-rebreather face mask  Post-op Pain: mild  Post-op Assessment: Post-op Vital signs reviewed, Patient's Cardiovascular Status Stable, Respiratory Function Stable, Patent Airway, No signs of Nausea or vomiting and Pain level controlled  Post-op Vital Signs: Reviewed and stable  Last Vitals:  Filed Vitals:   03/23/14 1815  BP: 116/63  Pulse: 87  Temp:   Resp: 14    Complications: No apparent anesthesia complications

## 2014-03-23 NOTE — ED Notes (Signed)
Pt transferred to surgical team. Report given. VSS.

## 2014-03-23 NOTE — ED Notes (Signed)
AC called for sandbag weights.

## 2014-03-23 NOTE — ED Notes (Signed)
Anesthesia in to see pt. Pt prepped for surgery. First bag of Ancef running.

## 2014-03-23 NOTE — ED Provider Notes (Signed)
CSN: [id number]     Arrival date & time 03/23/14  1021 History   First MD Initiated Contact with Patient 03/23/14 1048     Chief Complaint  Patient presents with  . Abdominal Pain     (Consider location/radiation/quality/duration/timing/severity/associated sxs/prior Treatment) HPI 63 year old female with history of ventral abdominal hernia reducible and asymptomatic for several months but now over the last 2 weeks has had intermittent mild pain lasting less than an hour a time still with reducible hernia which resolved pain but now over the last 2 days has constant hernia pain with nonreducible hernia with nausea and vomiting multiple times with no flatus or bowel movement in 2 days since hernia became painful and nonreducible; patient did have one episode of mild chest ache within the last week lasting less than half an hour with no shortness of breath radiation or associated symptoms. Patient has no fever no chest pain today no shortness of breath today no difficulty urinating. Patient's abdominal pain became constant 2 days ago and is moderate in severity. Past Medical History  Diagnosis Date  . Reflux   . Peptic ulcer   . Gallstones   . Arthritis   . Helicobacter pylori (H. pylori) infection   . Renal disorder    Past Surgical History  Procedure Laterality Date  . Lithotripsy     History reviewed. No pertinent family history. History  Substance Use Topics  . Smoking status: Never Smoker   . Smokeless tobacco: Never Used  . Alcohol Use: No   OB History    No data available     Review of Systems 10 Systems reviewed and are negative for acute change except as noted in the HPI.   Allergies  Avelox and Imitrex  Home Medications   Prior to Admission medications   Medication Sig Start Date End Date Taking? Authorizing Provider  cyclobenzaprine (FLEXERIL) 10 MG tablet Take 10 mg by mouth 3 (three) times daily as needed for muscle spasms.   Yes Historical Provider, MD   Dexlansoprazole (DEXILANT) 30 MG capsule Take 30 mg by mouth at bedtime.   Yes Historical Provider, MD  HYDROcodone-acetaminophen (NORCO/VICODIN) 5-325 MG per tablet Take 1 tablet by mouth every 6 (six) hours as needed for moderate pain.   Yes Historical Provider, MD  ibuprofen (ADVIL,MOTRIN) 400 MG tablet Take 400 mg by mouth every 6 (six) hours as needed. For pain   Yes Historical Provider, MD   BP 117/51 mmHg  Pulse 94  Temp(Src) 97.9 F (36.6 C) (Oral)  Resp 16  Ht 5\' 5"  (1.651 m)  Wt 250 lb (113.399 kg)  BMI 41.60 kg/m2  SpO2 100% Physical Exam  Constitutional:  Awake, alert, nontoxic appearance.  HENT:  Head: Atraumatic.  Eyes: Right eye exhibits no discharge. Left eye exhibits no discharge.  Neck: Neck supple.  Cardiovascular: Normal rate and regular rhythm.   No murmur heard. Pulmonary/Chest: Effort normal and breath sounds normal. No respiratory distress. She has no wheezes. She has no rales. She exhibits no tenderness.  Abdominal: Soft. Bowel sounds are normal. She exhibits mass. She exhibits no distension. There is tenderness. There is no rebound and no guarding.  Patient's abdomen is nontender except over her moderately tender ventral hernia mass which currently appears nonreducible with no rebound  Musculoskeletal: She exhibits no tenderness.  Baseline ROM, no obvious new focal weakness.  Neurological: She is alert.  Mental status and motor strength appears baseline for patient and situation.  Skin: No rash noted.  Psychiatric:  She has a normal mood and affect.  Nursing note and vitals reviewed.   ED Course  Procedures (including critical care time) Surg to see in ED. 1310 Patient / Family / Caregiver informed of clinical course, understand medical decision-making process, and agree with plan. EDP and Surg unable to reduce hernia in ED; Pt to OR.  Labs Review Labs Reviewed  CBC WITH DIFFERENTIAL - Abnormal; Notable for the following:    WBC 12.3 (*)     Hemoglobin 15.4 (*)    Neutro Abs 9.2 (*)    All other components within normal limits  COMPREHENSIVE METABOLIC PANEL - Abnormal; Notable for the following:    Glucose, Bld 118 (*)    GFR calc non Af Amer 76 (*)    GFR calc Af Amer 88 (*)    All other components within normal limits  URINALYSIS, ROUTINE W REFLEX MICROSCOPIC - Abnormal; Notable for the following:    Hgb urine dipstick TRACE (*)    All other components within normal limits  URINE MICROSCOPIC-ADD ON - Abnormal; Notable for the following:    Squamous Epithelial / LPF FEW (*)    All other components within normal limits  LIPASE, BLOOD  TROPONIN I  CBC  BASIC METABOLIC PANEL    Imaging Review Ct Abdomen Pelvis W Contrast  03/23/2014   CLINICAL DATA:  Abdominal pain and vomiting since Wednesday.Generalized abdominal pain R10.84 (ICD-10-CM) Ventral hernia, recurrence not specified K43.9 (ICD-10-CM) Nausea and vomiting, vomiting of unspecified type R11.2 (ICD-10-CM)  EXAM: CT ABDOMEN AND PELVIS WITH CONTRAST  TECHNIQUE: Multidetector CT imaging of the abdomen and pelvis was performed using the standard protocol following bolus administration of intravenous contrast.  CONTRAST:  53mL OMNIPAQUE IOHEXOL 300 MG/ML SOLN, 128mL OMNIPAQUE IOHEXOL 300 MG/ML SOLN  COMPARISON:  CT 07/31/2011  FINDINGS: Mild scarring or atelectasis at the posterior right lung base. No evidence for free intraperitoneal air.  Again noted is a moderate to large sized hiatal hernia. There is no significant inflammation around the hiatal hernia. Again noted is a low-density structure along the posterior right hepatic lobe which measures up to 1.9 cm. This probably represents a hepatic cyst. There is a small amount of perihepatic fluid along the inferior right hepatic lobe which is new. Otherwise, normal appearance of the liver and the portal venous system is patent. Small amount of high-density material at the base of the gallbladder is suggestive for a small stone or  stones. Normal appearance of the spleen, pancreas and adrenal glands. Again noted is a 5 mm stone in left kidney lower pole without hydronephrosis. There is also a nonobstructive 4 mm stone in the right kidney mid pole.  Proximal and mid small bowel loops are dilated and the distal small bowel loops, including the terminal ileum, are decompressed. There is a transition point associated with a large ventral hernia which contains fat and small bowel. Mild central mesenteric edema. Small amount of fluid in the right lower abdominal quadrant. Findings are compatible with an incarcerated hernia.  Normal appearance of the uterus and adnexal structures. Urinary bladder is decompressed. 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 Normal appearance of the appendix.  No acute bone abnormality.  IMPRESSION: Small bowel obstruction secondary to an incarcerated ventral hernia. There is mild mesenteric edema and a small amount of free fluid in the abdomen.  Nonobstructive bilateral kidney stones.  Probable cholelithiasis without evidence for gallbladder inflammation.  Moderate to large sized hiatal hernia.  These results  were called by telephone at the time of interpretation on 03/23/2014 at 12:21 pm to Dr. Riki Altes , who verbally acknowledged these results.   Electronically Signed   By: Markus Daft M.D.   On: 03/23/2014 12:22   Dg Chest Portable 1 View  03/23/2014   CLINICAL DATA:  63 year old with abdominal pain  EXAM: PORTABLE CHEST - 1 VIEW  COMPARISON:  None.  FINDINGS: The heart size and mediastinal contours are within normal limits.  There is no focal airspace consolidation, pleural effusion or pneumothorax.  The visualized skeletal structures are unremarkable.  IMPRESSION: No focal airspace consolidation.   Electronically Signed   By: Rosemarie Ax   On: 03/23/2014 15:52     EKG Interpretation   Date/Time:  Friday March 23 2014 11:22:24 EST Ventricular Rate:   87 PR Interval:  157 QRS Duration: 87 QT Interval:  389 QTC Calculation: 468 R Axis:   30 Text Interpretation:  Sinus rhythm Low voltage, precordial leads No  previous ECGs available Confirmed by [facility]  MD, Jenny Reichmann ([id number]) on  03/23/2014 11:35:24 AM      MDM   Final diagnoses:  Abdominal pain  Vomiting  Ventral hernia  Surgery follow-up  Small bowel obstruction due to incarcerated ventral hernia  The patient appears reasonably stabilized for admission considering the current resources, flow, and capabilities available in the ED at this time, and I doubt any other [facility] requiring further screening and/or treatment in the ED prior to admission.    Babette Relic, MD 03/23/14 [phone]

## 2014-03-23 NOTE — Anesthesia Postprocedure Evaluation (Signed)
  Anesthesia Post-op Note  Patient: Kaitlyn Williamson  Procedure(s) Performed: Procedure(s): VENTRAL HERNIORRHAPHY INSERTION OF MESH  Patient Location: PACU  Anesthesia Type:General  Level of Consciousness: awake and patient cooperative  Airway and Oxygen Therapy: Patient Spontanous Breathing and Patient connected to nasal cannula oxygen  Post-op Pain: mild  Post-op Assessment: Post-op Vital signs reviewed, Patient's Cardiovascular Status Stable, Respiratory Function Stable, Patent Airway, No signs of Nausea or vomiting and Pain level controlled  Post-op Vital Signs: Reviewed and stable  Last Vitals:  Filed Vitals:   03/23/14 1815  BP: 116/63  Pulse: 87  Temp:   Resp: 14    Complications: No apparent anesthesia complications. Will transport to 310 with nasal cannula. Oxygen Sat 94-95 on cannula.

## 2014-03-23 NOTE — ED Notes (Signed)
Weight placed on pt's abd. per MD order.

## 2014-03-23 NOTE — ED Notes (Signed)
Second gram of Ancef pulled and given to Anesthesia.

## 2014-03-23 NOTE — ED Notes (Signed)
Pt reports abdominal pain,vomiting since Wednesday. Pt denies any diarrhea,fever. Pt reports has abdominal hernia.

## 2014-03-23 NOTE — Anesthesia Procedure Notes (Signed)
Procedure Name: Intubation Date/Time: 03/23/2014 4:39 PM Performed by: Vista Deck Pre-anesthesia Checklist: Patient identified, Patient being monitored, Timeout performed, Emergency Drugs available and Suction available Patient Re-evaluated:Patient Re-evaluated prior to inductionOxygen Delivery Method: Circle System Utilized Preoxygenation: Pre-oxygenation with 100% oxygen Intubation Type: IV induction, Rapid sequence and Cricoid Pressure applied Laryngoscope Size: Miller and 2 Grade View: Grade I Tube type: Oral Tube size: 7.0 mm Number of attempts: 1 Airway Equipment and Method: stylet and Oral airway Placement Confirmation: ETT inserted through vocal cords under direct vision,  positive ETCO2 and breath sounds checked- equal and bilateral Secured at: 21 cm Tube secured with: Tape Dental Injury: Teeth and Oropharynx as per pre-operative assessment

## 2014-03-23 NOTE — H&P (Signed)
Kaitlyn Williamson is an 63 y.o. female.   Chief Complaint: Nausea and abdominal pain HPI: Patient is a 63 year old white female who has had a supraumbilical hernia for many months. 2 days ago, it started sticking out and she cannot reduce it. Her pain worsened and she developed nausea and vomiting. She presented the emergency room for further evaluation treatment. Despite multiple attempts, the hernia could not be reduced. CT scan of the abdomen reveals small bowel within the hernia.  Past Medical History  Diagnosis Date  . Reflux   . Peptic ulcer   . Gallstones   . Arthritis   . Helicobacter pylori (H. pylori) infection   . Renal disorder     Past Surgical History  Procedure Laterality Date  . Lithotripsy      History reviewed. No pertinent family history. Social History:  reports that she has never smoked. She has never used smokeless tobacco. She reports that she does not drink alcohol or use illicit drugs.  Allergies:  Allergies  Allergen Reactions  . Avelox [Moxifloxacin Hcl In Nacl] Anaphylaxis, Itching and Rash  . Imitrex [Sumatriptan]     Palpitations.     (Not in a hospital admission)  Results for orders placed or performed during the hospital encounter of 03/23/14 (from the past 48 hour(s))  CBC with Differential     Status: Abnormal   Collection Time: 03/23/14 10:56 AM  Result Value Ref Range   WBC 12.3 (H) 4.0 - 10.5 K/uL   RBC 5.07 3.87 - 5.11 MIL/uL   Hemoglobin 15.4 (H) 12.0 - 15.0 g/dL   HCT 45.4 36.0 - 46.0 %   MCV 89.5 78.0 - 100.0 fL   MCH 30.4 26.0 - 34.0 pg   MCHC 33.9 30.0 - 36.0 g/dL   RDW 13.8 11.5 - 15.5 %   Platelets 291 150 - 400 K/uL   Neutrophils Relative % 74 43 - 77 %   Neutro Abs 9.2 (H) 1.7 - 7.7 K/uL   Lymphocytes Relative 18 12 - 46 %   Lymphs Abs 2.2 0.7 - 4.0 K/uL   Monocytes Relative 8 3 - 12 %   Monocytes Absolute 1.0 0.1 - 1.0 K/uL   Eosinophils Relative 0 0 - 5 %   Eosinophils Absolute 0.0 0.0 - 0.7 K/uL   Basophils  Relative 0 0 - 1 %   Basophils Absolute 0.0 0.0 - 0.1 K/uL  Comprehensive metabolic panel     Status: Abnormal   Collection Time: 03/23/14 10:56 AM  Result Value Ref Range   Sodium 138 137 - 147 mEq/L   Potassium 3.9 3.7 - 5.3 mEq/L   Chloride 99 96 - 112 mEq/L   CO2 25 19 - 32 mEq/L   Glucose, Bld 118 (H) 70 - 99 mg/dL   BUN 18 6 - 23 mg/dL   Creatinine, Ser 0.82 0.50 - 1.10 mg/dL   Calcium 9.5 8.4 - 10.5 mg/dL   Total Protein 8.2 6.0 - 8.3 g/dL   Albumin 3.9 3.5 - 5.2 g/dL   AST 20 0 - 37 U/L   ALT 21 0 - 35 U/L   Alkaline Phosphatase 71 39 - 117 U/L   Total Bilirubin 0.5 0.3 - 1.2 mg/dL   GFR calc non Af Amer 76 (L) >90 mL/min   GFR calc Af Amer 88 (L) >90 mL/min    Comment: (NOTE) The eGFR has been calculated using the CKD EPI equation. This calculation has not been validated in all clinical situations. eGFR's  persistently <90 mL/min signify possible Chronic Kidney Disease.    Anion gap 14 5 - 15  Lipase, blood     Status: None   Collection Time: 03/23/14 10:56 AM  Result Value Ref Range   Lipase 13 11 - 59 U/L  Troponin I     Status: None   Collection Time: 03/23/14 10:56 AM  Result Value Ref Range   Troponin I <0.30 <0.30 ng/mL    Comment:        Due to the release kinetics of cTnI, a negative result within the first hours of the onset of symptoms does not rule out myocardial infarction with certainty. If myocardial infarction is still suspected, repeat the test at appropriate intervals.    Ct Abdomen Pelvis W Contrast  03/23/2014   CLINICAL DATA:  Abdominal pain and vomiting since Wednesday.Generalized abdominal pain R10.84 (ICD-10-CM) Ventral hernia, recurrence not specified K43.9 (ICD-10-CM) Nausea and vomiting, vomiting of unspecified type R11.2 (ICD-10-CM)  EXAM: CT ABDOMEN AND PELVIS WITH CONTRAST  TECHNIQUE: Multidetector CT imaging of the abdomen and pelvis was performed using the standard protocol following bolus administration of intravenous contrast.   CONTRAST:  58m OMNIPAQUE IOHEXOL 300 MG/ML SOLN, 1048mOMNIPAQUE IOHEXOL 300 MG/ML SOLN  COMPARISON:  CT 07/31/2011  FINDINGS: Mild scarring or atelectasis at the posterior right lung base. No evidence for free intraperitoneal air.  Again noted is a moderate to large sized hiatal hernia. There is no significant inflammation around the hiatal hernia. Again noted is a low-density structure along the posterior right hepatic lobe which measures up to 1.9 cm. This probably represents a hepatic cyst. There is a small amount of perihepatic fluid along the inferior right hepatic lobe which is new. Otherwise, normal appearance of the liver and the portal venous system is patent. Small amount of high-density material at the base of the gallbladder is suggestive for a small stone or stones. Normal appearance of the spleen, pancreas and adrenal glands. Again noted is a 5 mm stone in left kidney lower pole without hydronephrosis. There is also a nonobstructive 4 mm stone in the right kidney mid pole.  Proximal and mid small bowel loops are dilated and the distal small bowel loops, including the terminal ileum, are decompressed. There is a transition point associated with a large ventral hernia which contains fat and small bowel. Mild central mesenteric edema. Small amount of fluid in the right lower abdominal quadrant. Findings are compatible with an incarcerated hernia.  Normal appearance of the uterus and adnexal structures. Urinary bladder is decompressed. 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 Normal appearance of the appendix.  No acute bone abnormality.  IMPRESSION: Small bowel obstruction secondary to an incarcerated ventral hernia. There is mild mesenteric edema and a small amount of free fluid in the abdomen.  Nonobstructive bilateral kidney stones.  Probable cholelithiasis without evidence for gallbladder inflammation.  Moderate to large sized hiatal hernia.  These  results were called by telephone at the time of interpretation on 03/23/2014 at 12:21 pm to Dr. JORiki Altes who verbally acknowledged these results.   Electronically Signed   By: AdMarkus Daft.D.   On: 03/23/2014 12:22    Review of Systems  Constitutional: Positive for malaise/fatigue.  HENT: Negative.   Eyes: Negative.   Cardiovascular: Negative.   Gastrointestinal: Positive for nausea and abdominal pain.  Genitourinary: Negative.   Musculoskeletal: Negative.   Skin: Negative.     Blood pressure 147/88, pulse 89, temperature 97.9  F (36.6 C), temperature source Oral, resp. rate 13, height 5' 5"  (1.651 m), weight 113.399 kg (250 lb), SpO2 92 %. Physical Exam  Vitals reviewed. Constitutional: She is oriented to person, place, and time. She appears well-developed and well-nourished.  HENT:  Head: Normocephalic and atraumatic.  Neck: Normal range of motion. Neck supple.  Cardiovascular: Normal rate, regular rhythm and normal heart sounds.   Respiratory: Effort normal and breath sounds normal.  GI: Soft. She exhibits mass. There is tenderness.  Incarcerated supraumbilical hernia present. Mild skin erythema noted. Nonreducible.  Neurological: She is alert and oriented to person, place, and time.  Skin: Skin is warm and dry.     Assessment/Plan Impression: Incarcerated ventral hernia Plan:  Patient be taken to the operating room emergently for exploratory laparotomy, ventral herniorrhaphy, possible bowel resection. Risks and benefits of the procedure including bleeding, infection, and a possibly of a bowel resection were fully explained to the patient, who gave informed consent.  Benny Henrie A 03/23/2014, 3:26 PM

## 2014-03-23 NOTE — Transfer of Care (Signed)
Immediate Anesthesia Transfer of Care Note  Patient: Kaitlyn Williamson  Procedure(s) Performed: Procedure(s): VENTRAL HERNIORRHAPHY INSERTION OF MESH  Patient Location: PACU  Anesthesia Type: MAC  Level of Consciousness: awake  Airway & Oxygen Therapy: Patient Spontanous Breathing. Nasal cannula  Post-op Assessment: Report given to PACU RN, Post -op Vital signs reviewed and stable and Patient moving all extremities  Post vital signs: Reviewed and stable  Complications: No apparent anesthesia complications

## 2014-03-23 NOTE — Op Note (Signed)
Patient:  Kaitlyn Williamson  DOB:  20-Aug-1950  MRN:  [id number]   Preop Diagnosis:  Incarcerated ventral hernia  Postop Diagnosis:  Same  Procedure:  Exploratory laparotomy, ventral herniorrhaphy with mesh  Surgeon:  Aviva Signs, M.D.  Anes:  Gen. endotracheal  Indications:  Patient is a 63 year old white female with a known periumbilical hernia who 2 days ago developed pain and swelling just above the umbilicus. She was unable to reduce it. She started having worsening pain today and presented to the emergency room. CT scan of the abdomen showed small bowel within the hernia. It was a narrow neck hernia orifice. Despite multiple attempts at reduction, the hernia was incarcerated. Patient now comes the operating room emergently for exploratory laparotomy. The risks and benefits of the procedure including bleeding, infection, and the possibility of a partial small bowel resection were fully explained to the patient, who gave informed consent.  Procedure note:  The patient was placed the supine position. After induction of general endotracheal anesthesia, the abdomen was prepped and draped using usual sterile technique with ChloraPrep. Surgical site confirmation was performed.  A transverse incision was made superior to the umbilicus. This was taken down to the hernia sac. The hernia sac was entered into and the was omentum along with a loop of small intestine which was erythematous and edematous. The omentum was reduced. The base of the hernia was widened in order to fully expose the small bowel. Once the small bowel was released, it was inspected and noted to pink up. It did have peristalsis. There was no stenosis. There did not appear to be any significant bowel injury present. The small bowel was then reduced. The hernia sac was excised and disposed of.  A small umbilical hernia just inferior to the primary hernia was closed internally using 0 Ethibond interrupted sutures.  An 8 cm Ventralex ST  hernia patch was then inserted and secured to the fascia using 0 Ethibond interrupted sutures. The overlying fascia was then closed transversely over the mesh repair using 0 Ethibond interrupted sutures. The subcutaneous layer was reapproximated using a 2-0 Vicryl interrupted suture. 0.5% Sensorcaine was instilled the surrounding wound. The incision was closed using staples. Betadine ointment and a dry sterile dressing was applied.  All tape and needle counts were correct at the end of the procedure. Patient was extubated in the operating room and transferred to PACU in stable condition.  Complications:  None  EBL:  Minimal  Specimen:  None

## 2014-03-23 NOTE — Anesthesia Preprocedure Evaluation (Signed)
Anesthesia Evaluation  Patient identified by MRN, date of birth, ID band Patient awake    Reviewed: Allergy & Precautions, H&P , NPO status , Patient's Chart, lab work & pertinent test results  History of Anesthesia Complications (+) history of anesthetic complications  Airway Mallampati: II  TM Distance: >3 FB Neck ROM: Full    Dental  (+) Teeth Intact   Pulmonary  breath sounds clear to auscultation        Cardiovascular Exercise Tolerance: Poor Rhythm:Regular     Neuro/Psych    GI/Hepatic PUD, GERD-  Controlled,Patient received Oral Contrast Agents,Nausea continues post mds   Endo/Other    Renal/GU Renal disease     Musculoskeletal  (+) Arthritis -,   Abdominal (+)  Abdomen: tender.    Peds  Hematology   Anesthesia Other Findings Patient states tip end of tongue  was swollen and numb for several months after her last surgery. No complaints about numbness now. Tongue within normal limits today.  Reproductive/Obstetrics                             Anesthesia Physical Anesthesia Plan  ASA: II and emergent  Anesthesia Plan: General   Post-op Pain Management:    Induction: Rapid sequence, Cricoid pressure planned and Intravenous  Airway Management Planned: Oral ETT  Additional Equipment:   Intra-op Plan:   Post-operative Plan:   Informed Consent: I have reviewed the patients History and Physical, chart, labs and discussed the procedure including the risks, benefits and alternatives for the proposed anesthesia with the patient or authorized representative who has indicated his/her understanding and acceptance.   Dental advisory given  Plan Discussed with: Anesthesiologist and Surgeon  Anesthesia Plan Comments:         Anesthesia Quick Evaluation

## 2014-03-24 LAB — CBC
HEMATOCRIT: 37.1 % (ref 36.0–46.0)
Hemoglobin: 12.3 g/dL (ref 12.0–15.0)
MCH: 30.2 pg (ref 26.0–34.0)
MCHC: 33.2 g/dL (ref 30.0–36.0)
MCV: 91.2 fL (ref 78.0–100.0)
PLATELETS: 213 10*3/uL (ref 150–400)
RBC: 4.07 MIL/uL (ref 3.87–5.11)
RDW: 14.1 % (ref 11.5–15.5)
WBC: 7.7 10*3/uL (ref 4.0–10.5)

## 2014-03-24 LAB — BASIC METABOLIC PANEL
ANION GAP: 8 (ref 5–15)
BUN: 14 mg/dL (ref 6–23)
CALCIUM: 8 mg/dL — AB (ref 8.4–10.5)
CHLORIDE: 106 meq/L (ref 96–112)
CO2: 27 mEq/L (ref 19–32)
CREATININE: 0.78 mg/dL (ref 0.50–1.10)
GFR calc Af Amer: >90 mL/min (ref >90)
GFR calc non Af Amer: 88 mL/min — ABNORMAL LOW (ref >90)
Glucose, Bld: 105 mg/dL — ABNORMAL HIGH (ref 70–99)
Potassium: 4 mEq/L (ref 3.7–5.3)
Sodium: 141 mEq/L (ref 137–147)

## 2014-03-24 MED ORDER — ACETAMINOPHEN 325 MG PO TABS: 650.0000 mg | ORAL_TABLET | Freq: Four times a day (QID) | ORAL | Status: DC | PRN

## 2014-03-24 NOTE — Plan of Care (Signed)
Problem: Phase I Progression Outcomes Goal: Pain controlled with appropriate interventions Outcome: Completed/Met Date Met:  03/24/14 Goal: Incision/dressings dry and intact Outcome: Completed/Met Date Met:  03/24/14 Goal: Tubes/drains patent Outcome: Not Applicable Date Met:  72/09/47 Goal: Initial discharge plan identified Outcome: Completed/Met Date Met:  03/24/14 Goal: Vital signs/hemodynamically stable Outcome: Completed/Met Date Met:  03/24/14 Goal: Other Phase I Outcomes/Goals Outcome: Not Applicable Date Met:  09/62/83

## 2014-03-24 NOTE — Progress Notes (Signed)
1 Day Post-Op  Subjective: Mild incisional pain. No bowel movement or flatus yet.  Objective: Vital signs in last 24 hours: Temp:  [97.9 F (36.6 C)-100.2 F (37.9 C)] 100.2 F (37.9 C) (11/28 1003) Pulse Rate:  [81-98] 98 (11/28 1003) Resp:  [11-24] 16 (11/28 0635) BP: (93-153)/(47-95) 108/60 mmHg (11/28 1003) SpO2:  [90 %-100 %] 92 % (11/28 1003) Last BM Date: 03/21/14  Intake/Output from previous day: 11/27 0701 - 11/28 0700 In: 900 [I.V.:900] Out: 510 [Blood:10] Intake/Output this shift:    General appearance: alert, cooperative and no distress Resp: clear to auscultation bilaterally Cardio: regular rate and rhythm, S1, S2 normal, no murmur, click, rub or gallop GI: Soft. Dressing intact. Occasional bowel sounds appreciated.  Lab Results:   Recent Labs  03/23/14 1056 03/24/14 0613  WBC 12.3* 7.7  HGB 15.4* 12.3  HCT 45.4 37.1  PLT 291 213   BMET  Recent Labs  03/23/14 1056 03/24/14 0613  NA 138 141  K 3.9 4.0  CL 99 106  CO2 25 27  GLUCOSE 118* 105*  BUN 18 14  CREATININE 0.82 0.78  CALCIUM 9.5 8.0*   PT/INR No results for input(s): LABPROT, INR in the last 72 hours.  Studies/Results: Ct Abdomen Pelvis W Contrast  03/23/2014   CLINICAL DATA:  Abdominal pain and vomiting since Wednesday.Generalized abdominal pain R10.84 (ICD-10-CM) Ventral hernia, recurrence not specified K43.9 (ICD-10-CM) Nausea and vomiting, vomiting of unspecified type R11.2 (ICD-10-CM)  EXAM: CT ABDOMEN AND PELVIS WITH CONTRAST  TECHNIQUE: Multidetector CT imaging of the abdomen and pelvis was performed using the standard protocol following bolus administration of intravenous contrast.  CONTRAST:  29mL OMNIPAQUE IOHEXOL 300 MG/ML SOLN, 167mL OMNIPAQUE IOHEXOL 300 MG/ML SOLN  COMPARISON:  CT 07/31/2011  FINDINGS: Mild scarring or atelectasis at the posterior right lung base. No evidence for free intraperitoneal air.  Again noted is a moderate to large sized hiatal hernia. There is no  significant inflammation around the hiatal hernia. Again noted is a low-density structure along the posterior right hepatic lobe which measures up to 1.9 cm. This probably represents a hepatic cyst. There is a small amount of perihepatic fluid along the inferior right hepatic lobe which is new. Otherwise, normal appearance of the liver and the portal venous system is patent. Small amount of high-density material at the base of the gallbladder is suggestive for a small stone or stones. Normal appearance of the spleen, pancreas and adrenal glands. Again noted is a 5 mm stone in left kidney lower pole without hydronephrosis. There is also a nonobstructive 4 mm stone in the right kidney mid pole.  Proximal and mid small bowel loops are dilated and the distal small bowel loops, including the terminal ileum, are decompressed. There is a transition point associated with a large ventral hernia which contains fat and small bowel. Mild central mesenteric edema. Small amount of fluid in the right lower abdominal quadrant. Findings are compatible with an incarcerated hernia.  Normal appearance of the uterus and adnexal structures. Urinary bladder is decompressed. 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 Normal appearance of the appendix.  No acute bone abnormality.  IMPRESSION: Small bowel obstruction secondary to an incarcerated ventral hernia. There is mild mesenteric edema and a small amount of free fluid in the abdomen.  Nonobstructive bilateral kidney stones.  Probable cholelithiasis without evidence for gallbladder inflammation.  Moderate to large sized hiatal hernia.  These results were called by telephone at the time of interpretation  on 03/23/2014 at 12:21 pm to Dr. Riki Altes , who verbally acknowledged these results.   Electronically Signed   By: Markus Daft M.D.   On: 03/23/2014 12:22   Dg Chest Portable 1 View  03/23/2014   CLINICAL DATA:  63 year old with  abdominal pain  EXAM: PORTABLE CHEST - 1 VIEW  COMPARISON:  None.  FINDINGS: The heart size and mediastinal contours are within normal limits.  There is no focal airspace consolidation, pleural effusion or pneumothorax.  The visualized skeletal structures are unremarkable.  IMPRESSION: No focal airspace consolidation.   Electronically Signed   By: Rosemarie Ax   On: 03/23/2014 15:52    Anti-infectives: Anti-infectives    Start     Dose/Rate Route Frequency Ordered Stop   03/23/14 1545  [MAR Hold]  ceFAZolin (ANCEF) IVPB 1 g/50 mL premix     (MAR Hold since 03/23/14 1647)   1 g100 mL/hr over 30 Minutes Intravenous  Once 03/23/14 1533 03/23/14 1648   03/23/14 1545  ceFAZolin (ANCEF) IVPB 1 g/50 mL premix     1 g100 mL/hr over 30 Minutes Intravenous  Once 03/23/14 1533 03/23/14 1609      Assessment/Plan: s/p Procedure(s): VENTRAL HERNIORRHAPHY INSERTION OF MESH Impression: Stable on postoperative day 1.  Plan: Advance to full liquid diet. We'll start ambulating patient. Adjust IV fluids.  LOS: 1 day    Rorey Hodges A 03/24/2014

## 2014-03-24 NOTE — Plan of Care (Signed)
Problem: Phase I Progression Outcomes Goal: OOB as tolerated unless otherwise ordered Outcome: Completed/Met Date Met:  03/24/14 Goal: Voiding-avoid urinary catheter unless indicated Outcome: Completed/Met Date Met:  03/24/14

## 2014-03-24 NOTE — Progress Notes (Signed)
Upon assessment, patient's IV had drainage.  IV removed and was clean, dry, and intact.  Patient stated that she did not want a new IV started if it was not needed.  Notified MD and received verbal order to leave IV out.  Will encourage patient to continue to drink fluids.

## 2014-03-25 LAB — CBC
HCT: 35.3 % — ABNORMAL LOW (ref 36.0–46.0)
HEMOGLOBIN: 11.7 g/dL — AB (ref 12.0–15.0)
MCH: 30.1 pg (ref 26.0–34.0)
MCHC: 33.1 g/dL (ref 30.0–36.0)
MCV: 90.7 fL (ref 78.0–100.0)
Platelets: 207 10*3/uL (ref 150–400)
RBC: 3.89 MIL/uL (ref 3.87–5.11)
RDW: 13.9 % (ref 11.5–15.5)
WBC: 6.7 10*3/uL (ref 4.0–10.5)

## 2014-03-25 LAB — BASIC METABOLIC PANEL
Anion gap: 11 (ref 5–15)
BUN: 10 mg/dL (ref 6–23)
CALCIUM: 7.9 mg/dL — AB (ref 8.4–10.5)
CO2: 25 mEq/L (ref 19–32)
Chloride: 101 mEq/L (ref 96–112)
Creatinine, Ser: 0.76 mg/dL (ref 0.50–1.10)
GFR, EST NON AFRICAN AMERICAN: 89 mL/min — AB (ref >90)
GLUCOSE: 94 mg/dL (ref 70–99)
Potassium: 3.6 mEq/L — ABNORMAL LOW (ref 3.7–5.3)
Sodium: 137 mEq/L (ref 137–147)

## 2014-03-25 MED ORDER — HYDROCODONE-ACETAMINOPHEN 5-325 MG PO TABS: 1.0000 | ORAL_TABLET | Freq: Four times a day (QID) | ORAL | Status: AC | PRN

## 2014-03-25 NOTE — Anesthesia Postprocedure Evaluation (Signed)
  Anesthesia Post-op Note  Patient: Kaitlyn Williamson  Procedure(s) Performed: Procedure(s): VENTRAL HERNIORRHAPHY INSERTION OF MESH  Patient Location: [facility]  Anesthesia Type:General  Level of Consciousness: awake, alert  and patient cooperative  Airway and Oxygen Therapy: Patient Spontanous Breathing  Post-op Pain: mild  Post-op Assessment: Post-op Vital signs reviewed, Patient's Cardiovascular Status Stable, Respiratory Function Stable, Patent Airway, No signs of Nausea or vomiting, Adequate PO intake and Pain level controlled  Post-op Vital Signs: Reviewed and stable  Last Vitals:  Filed Vitals:   03/25/14 0615  BP: 116/65  Pulse: 86  Temp: 37.1 C  Resp: 18    Complications: No apparent anesthesia complications

## 2014-03-25 NOTE — Discharge Instructions (Signed)
Open Hernia Repair, Care After °Refer to this sheet in the next few weeks. These instructions provide you with information on caring for yourself after your procedure. Your health care provider may also give you more specific instructions. Your treatment has been planned according to current medical practices, but problems sometimes occur. Call your health care provider if you have any problems or questions after your procedure. °WHAT TO EXPECT AFTER THE PROCEDURE °After your procedure, it is typical to have the following: °· Pain in your abdomen, especially along your incision. You will be given pain medicines to control the pain. °· Constipation. You may be given a stool softener to help prevent this. °HOME CARE INSTRUCTIONS  °· Only take over-the-counter or prescription medicines as directed by your health care provider. °· Keep the wound dry and clean. You may wash the wound gently with soap and water 48 hours after surgery. Gently blot or dab the wound dry. Do not take baths, use swimming pools, or use hot tubs for 10 days or until your health care provider approves. °· Change bandages (dressings) as directed by your health care provider. °· Continue your normal diet as directed by your health care provider. Eat plenty of fruits and vegetables to help prevent constipation. °· Drink enough fluids to keep your urine clear or pale yellow. This also helps prevent constipation. °· Do not drive until your health care provider says it is okay. °· Do not lift anything heavier than 10 pounds (4.5 kg) or play contact sports for 4 weeks or until your health care provider approves. °· Follow up with your health care provider as directed. Ask your health care provider when to make an appointment to have your stitches (sutures) or staples removed. °SEEK MEDICAL CARE IF:  °· You have increased bleeding coming from the incision site. °· You have blood in your stool. °· You have increasing pain in the wound. °· You see redness  or swelling in the wound. °· You have fluid (pus) coming from the wound. °· You have a fever. °· You notice a bad smell coming from the wound or dressing. °SEEK IMMEDIATE MEDICAL CARE IF:  °· You develop a rash. °· You have chest pain or shortness of breath. °· You feel lightheaded or feel faint. °Document Released: 10/31/2004 Document Revised: 02/01/2013 Document Reviewed: 11/23/2012 °[employer]® Patient Information ©2015 [employer]. This information is not intended to replace advice given to you by your health care provider. Make sure you discuss any questions you have with your health care provider. ° °

## 2014-03-25 NOTE — Discharge Summary (Signed)
Physician Discharge Summary  Patient ID: Kaitlyn Williamson MRN: [id number] DOB/AGE: [birth year] 63 y.o.  Admit date: 03/23/2014 Discharge date: 03/25/2014  Admission Diagnoses: Incarcerated ventral hernia  Discharge Diagnoses: Same Active Problems:   Incarcerated ventral hernia   Discharged Condition: good  Hospital Course: Patient is a 63 year old morbidly obese white female who presented to the emergency room with a 48-hour history of worsening abdominal pain, nausea, vomiting. She had a ventral hernia around the umbilicus which she could not reduce. This could not be reduced in the emergency room, thus the patient was taken emergently to the operating room for exploratory laparotomy. She was found to have an incarcerated ventral hernia, though no bowel had to be resected. She tolerated the procedure well. Her postoperative course was unremarkable. Her diet was advanced without difficulty. She is being discharged home in good improving condition.  Treatments: surgery: Ventral herniorrhaphy with mesh on 03/23/2014  Discharge Exam: Blood pressure 116/65, pulse 86, temperature 98.8 F (37.1 C), temperature source Oral, resp. rate 18, height 5\' 5"  (1.651 m), weight 113.399 kg (250 lb), SpO2 95 %. General appearance: alert, cooperative and no distress Resp: clear to auscultation bilaterally Cardio: regular rate and rhythm, S1, S2 normal, no murmur, click, rub or gallop GI: Soft. Incision healing well.  Disposition: 01-Home or Self Care     Medication List    TAKE these medications        cyclobenzaprine 10 MG tablet  Commonly known as:  FLEXERIL  Take 10 mg by mouth 3 (three) times daily as needed for muscle spasms.     DEXILANT 30 MG capsule  Generic drug:  Dexlansoprazole  Take 30 mg by mouth at bedtime.     HYDROcodone-acetaminophen 5-325 MG per tablet  Commonly known as:  NORCO/VICODIN  Take 1-2 tablets by mouth every 6 (six) hours as needed for moderate pain.     ibuprofen 400 MG tablet  Commonly known as:  ADVIL,MOTRIN  Take 400 mg by mouth every 6 (six) hours as needed. For pain           Follow-up Information    Follow up with Jamesetta So, MD. Schedule an appointment as soon as possible for a visit on 04/03/2014.   Specialty:  General Surgery   Contact information:   [id number] [id number] [phone]       Signed: Aviva Signs A 03/25/2014, 11:28 AM

## 2014-03-25 NOTE — Progress Notes (Signed)
Kaitlyn Williamson and family received discharge instructions. She was informed of necessary precautions that needed to be taken once home, related to driving, lifting, and incision care. She was informed to schedule a follow up appointment. Patient left unit with family in wheelchair via nursing staff.   Candice Camp, RN

## 2014-03-25 NOTE — Plan of Care (Signed)
Problem: Phase II Progression Outcomes Goal: Pain controlled Outcome: Completed/Met Date Met:  03/25/14 Goal: Progress activity as tolerated unless otherwise ordered Outcome: Completed/Met Date Met:  03/25/14 Goal: Progressing with IS, TCDB Outcome: Completed/Met Date Met:  03/25/14 Goal: Vital signs stable Outcome: Completed/Met Date Met:  03/25/14 Goal: Surgical site without signs of infection Outcome: Completed/Met Date Met:  03/25/14 Goal: Dressings dry/intact Outcome: Completed/Met Date Met:  03/25/14 Goal: Foley discontinued Outcome: Not Applicable Date Met:  75/88/32 Goal: Discharge plan established Outcome: Completed/Met Date Met:  03/25/14 Goal: Tolerating diet Outcome: Completed/Met Date Met:  03/25/14

## 2014-03-25 NOTE — Addendum Note (Signed)
Addendum  created 03/25/14 1127 by Vista Deck, CRNA   Modules edited: Notes Section   Notes Section:  File: [id number]

## 2014-03-25 NOTE — Plan of Care (Signed)
Problem: Phase II Progression Outcomes Goal: Return of bowel function (flatus, BM) IF ABDOMINAL SURGERY:  Outcome: Progressing     

## 2014-03-26 ENCOUNTER — Encounter (HOSPITAL_COMMUNITY): Payer: Self-pay | Admitting: General Surgery

## 2014-03-28 NOTE — Care Management Utilization Note (Signed)
UR completed 

## 2016-05-13 IMAGING — CT CT ABD-PELV W/ CM
2 of 4 series · 16 of 46 positions shown, 18 images · IV contrast (omnipaque)
Comparison: CT 07/31/2011

CLINICAL DATA: Abdominal pain and vomiting since
[REDACTED].Generalized abdominal pain H9Q.6C (YN4-TS-CM)
Ventral hernia, recurrence not specified CJ5.I (YN4-TS-CM)
Nausea and vomiting, vomiting of unspecified type H33.9 (YN4-TS-CM)

EXAM:
CT ABDOMEN AND PELVIS WITH CONTRAST
TECHNIQUE: Multidetector CT imaging of the abdomen and pelvis was performed
using the standard protocol following bolus administration of
intravenous contrast.
CONTRAST:  50mL OMNIPAQUE IOHEXOL 300 MG/ML SOLN, 100mL OMNIPAQUE
IOHEXOL 300 MG/ML SOLN

[Series 2: abd_pel_with 5.0 b40s · axial · 0.91mm/px · z∈[-458,-18]mm · 13 of 98 slices shown, 15 images]
[im 5/98  soft-tissue]
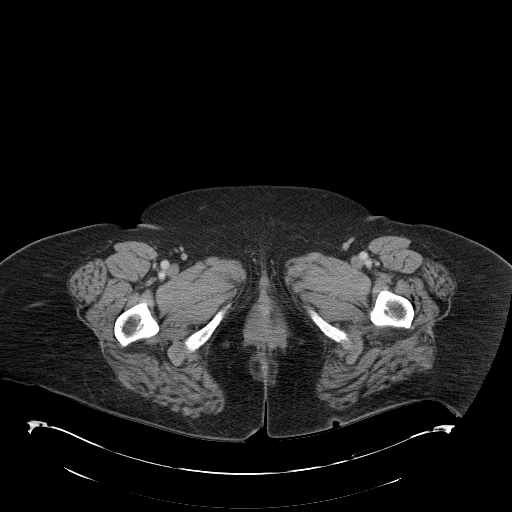
[im 5/98  bone]
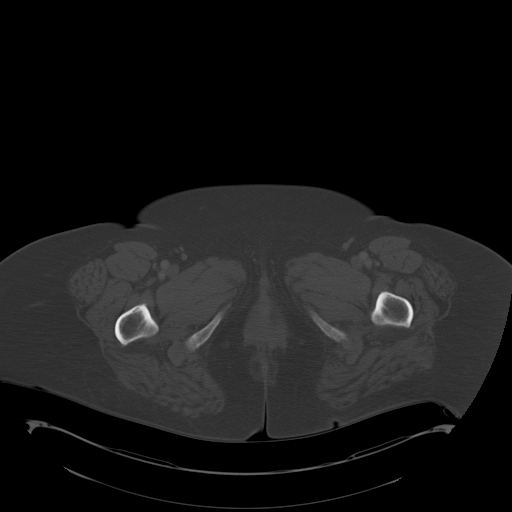
[im 13/98  soft-tissue]
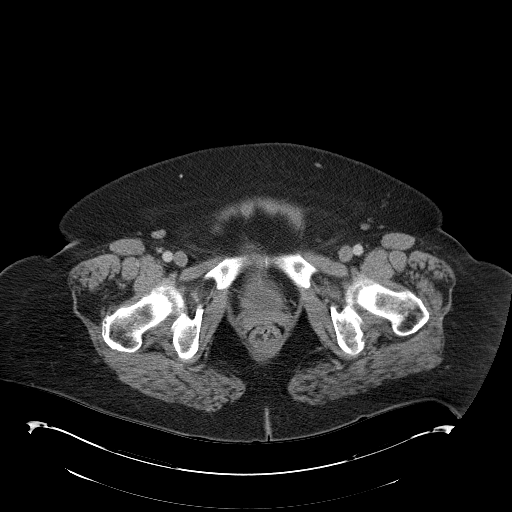
[im 22/98  soft-tissue]
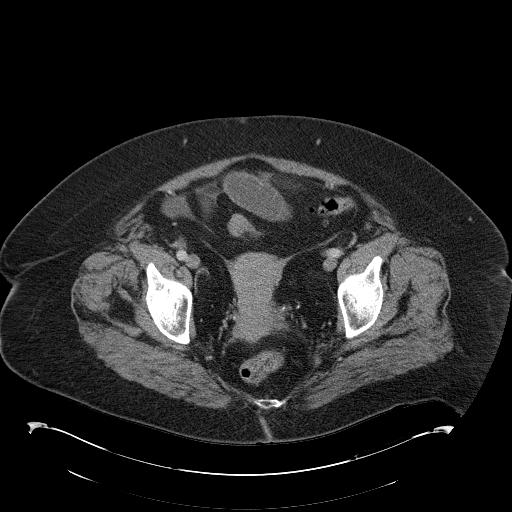
[im 26/98  soft-tissue]
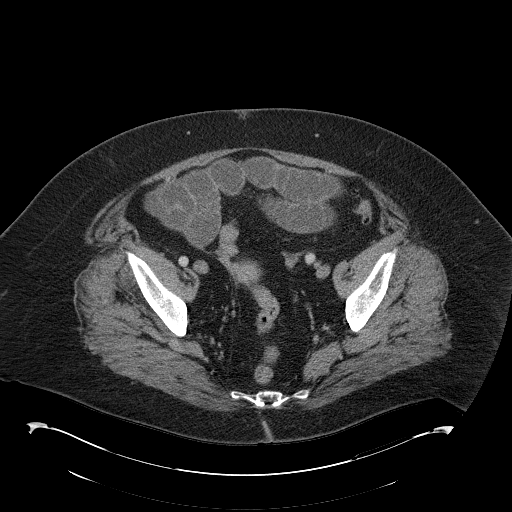
[im 34/98  soft-tissue]
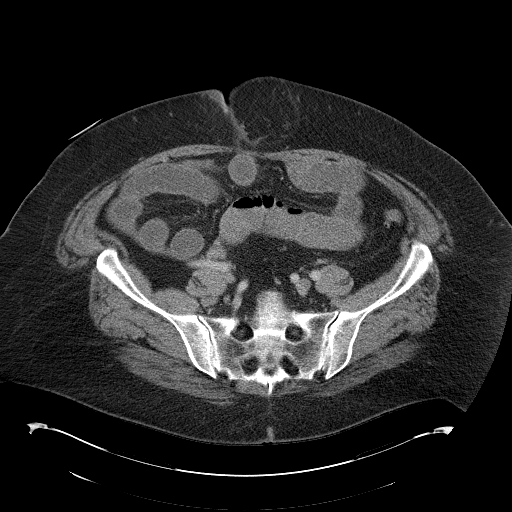
[im 43/98  soft-tissue]
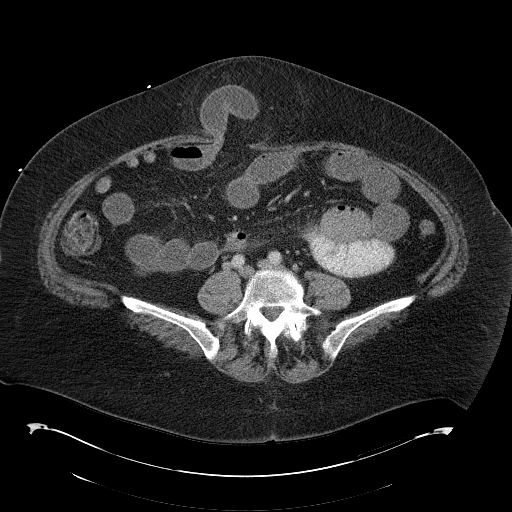
[im 51/98  soft-tissue]
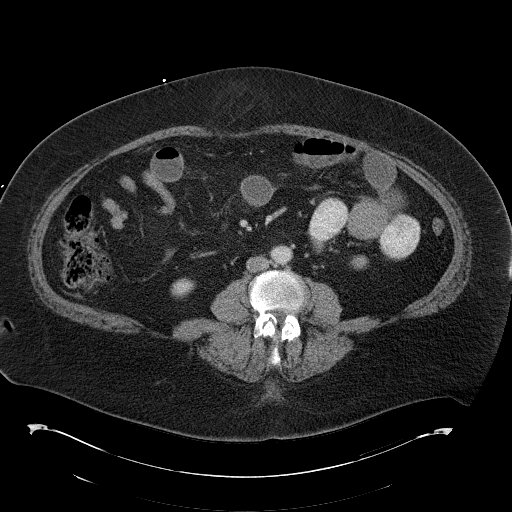
[im 55/98  soft-tissue]
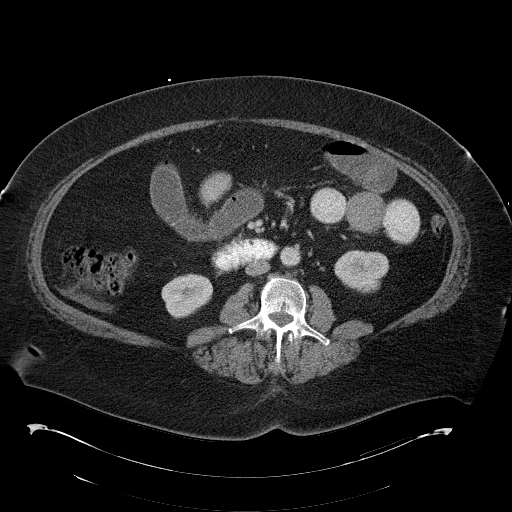
[im 64/98  soft-tissue]
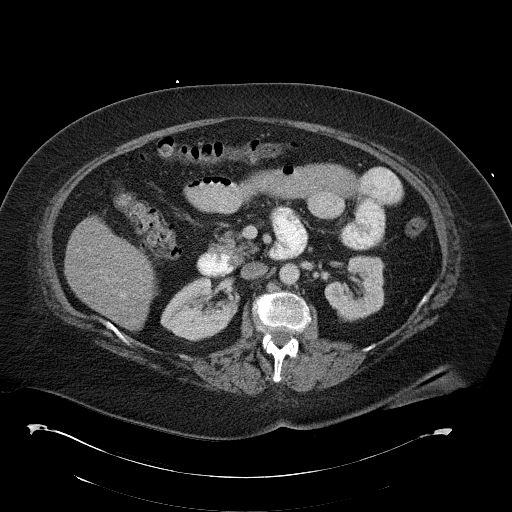
[im 64/98  bone]
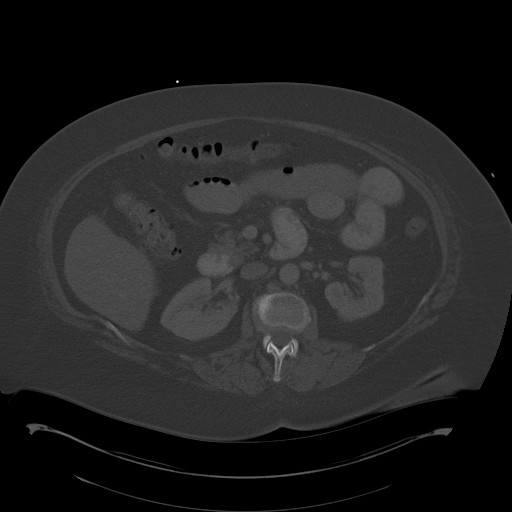
[im 72/98  soft-tissue]
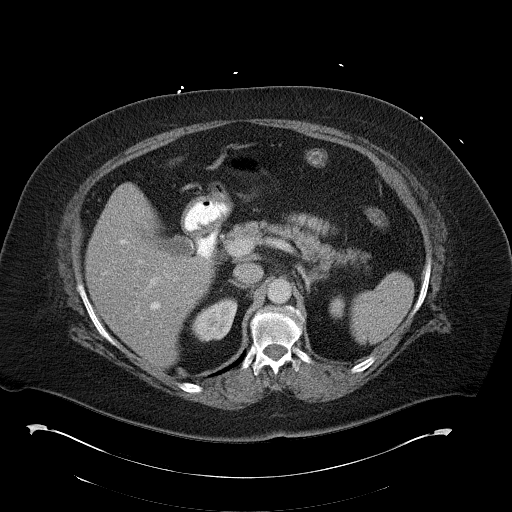
[im 76/98  soft-tissue]
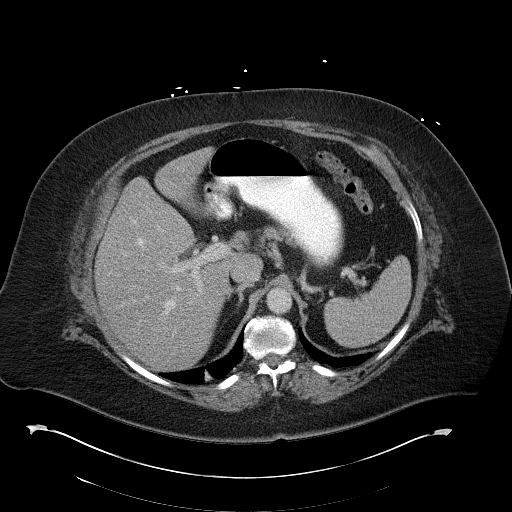
[im 85/98  soft-tissue]
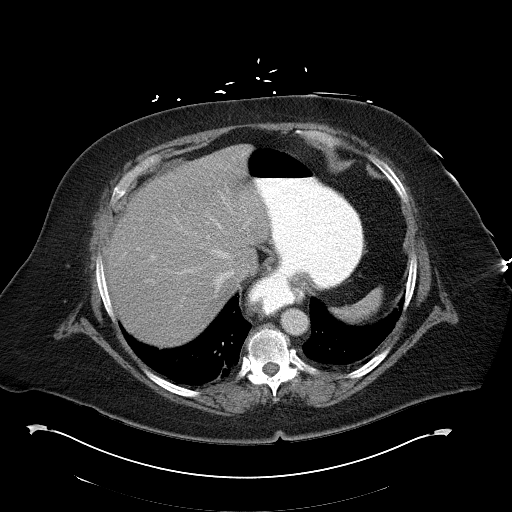
[im 93/98  soft-tissue]
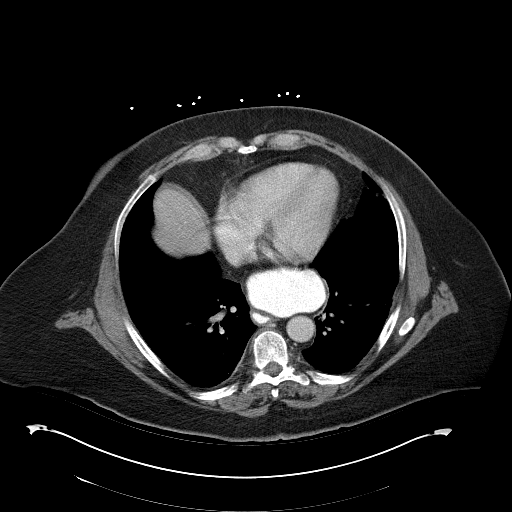

[Series 3: mpr cor post contrast (id) · coronal · 0.86mm/px · 3 of 111 slices shown]
[im 37/111  soft-tissue]
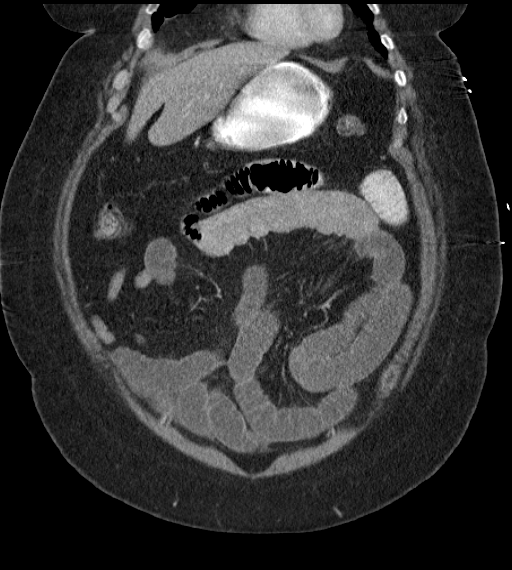
[im 49/111  soft-tissue]
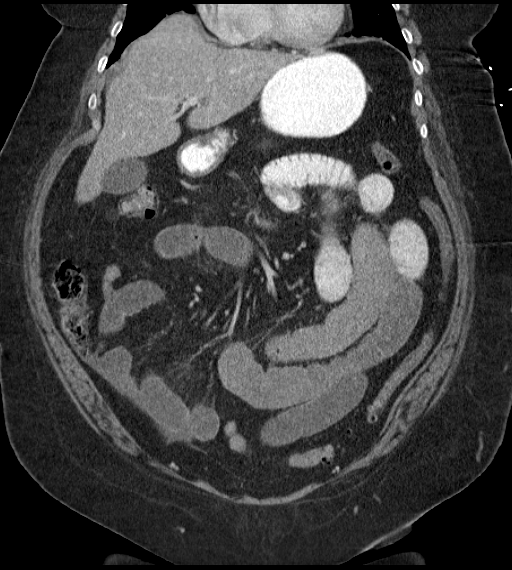
[im 62/111  soft-tissue]
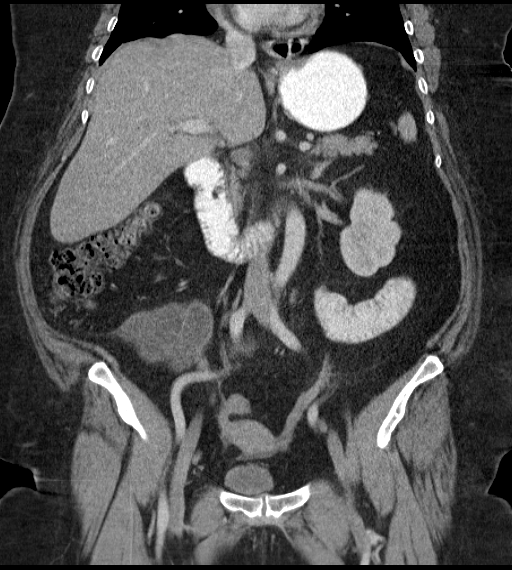

[16 of 46 positions shown; findings below may reference images not displayed]

FINDINGS: Mild scarring or atelectasis at the posterior right lung base. No
evidence for free intraperitoneal air.

Again noted is a moderate to large sized hiatal hernia. There is no
significant inflammation around the hiatal hernia. Again noted is a
low-density structure along the posterior right hepatic lobe which
measures up to 1.9 cm. This probably represents a hepatic cyst.
There is a small amount of perihepatic fluid along the inferior
right hepatic lobe which is new. Otherwise, normal appearance of the
liver and the portal venous system is patent. Small amount of
high-density material at the base of the gallbladder is suggestive
for a small stone or stones. Normal appearance of the spleen,
pancreas and adrenal glands. Again noted is a 5 mm stone in left
kidney lower pole without hydronephrosis. There is also a
nonobstructive 4 mm stone in the right kidney mid pole.

Proximal and mid small bowel loops are dilated and the distal small
bowel loops, including the terminal ileum, are decompressed. There
is a transition point associated with a large ventral hernia which
contains fat and small bowel. Mild central mesenteric edema. Small
amount of fluid in the right lower abdominal quadrant. Findings are
compatible with an incarcerated hernia.

Normal appearance of the uterus and adnexal structures. Urinary
bladder is decompressed. Diverticula involving the sigmoid and
descending colon without acute colonic inflammation. Normal
appearance of the appendix.

No acute bone abnormality.
IMPRESSION: Small bowel obstruction secondary to an incarcerated ventral hernia.
There is mild mesenteric edema and a small amount of free fluid in
the abdomen.

Nonobstructive bilateral kidney stones.

Probable cholelithiasis without evidence for gallbladder
inflammation.

Moderate to large sized hiatal hernia.

These results were called by telephone at the time of interpretation
on 03/23/2014 at [DATE] to Dr. KUAI LAN TERPESONA , who verbally
acknowledged these results.

## 2021-12-04 DIAGNOSIS — Z130 Encounter for screening for diseases of the blood and blood-forming organs and certain disorders involving the immune mechanism: Secondary | ICD-10-CM | POA: Diagnosis not present

## 2021-12-04 DIAGNOSIS — Z1211 Encounter for screening for malignant neoplasm of colon: Secondary | ICD-10-CM | POA: Diagnosis not present

## 2021-12-04 DIAGNOSIS — Z6841 Body Mass Index (BMI) 40.0 and over, adult: Secondary | ICD-10-CM | POA: Diagnosis not present

## 2021-12-04 DIAGNOSIS — Z1321 Encounter for screening for nutritional disorder: Secondary | ICD-10-CM | POA: Diagnosis not present

## 2021-12-04 DIAGNOSIS — Z1329 Encounter for screening for other suspected endocrine disorder: Secondary | ICD-10-CM | POA: Diagnosis not present

## 2021-12-04 DIAGNOSIS — Z131 Encounter for screening for diabetes mellitus: Secondary | ICD-10-CM | POA: Diagnosis not present

## 2021-12-04 DIAGNOSIS — Z0000 Encounter for general adult medical examination without abnormal findings: Secondary | ICD-10-CM | POA: Diagnosis not present

## 2021-12-04 DIAGNOSIS — N898 Other specified noninflammatory disorders of vagina: Secondary | ICD-10-CM | POA: Diagnosis not present

## 2021-12-04 DIAGNOSIS — Z87442 Personal history of urinary calculi: Secondary | ICD-10-CM | POA: Diagnosis not present

## 2021-12-04 DIAGNOSIS — N6341 Unspecified lump in right breast, subareolar: Secondary | ICD-10-CM | POA: Diagnosis not present

## 2021-12-08 DIAGNOSIS — R928 Other abnormal and inconclusive findings on diagnostic imaging of breast: Secondary | ICD-10-CM | POA: Diagnosis not present

## 2021-12-08 DIAGNOSIS — N6341 Unspecified lump in right breast, subareolar: Secondary | ICD-10-CM | POA: Diagnosis not present

## 2021-12-08 DIAGNOSIS — R930 Abnormal findings on diagnostic imaging of skull and head, not elsewhere classified: Secondary | ICD-10-CM | POA: Diagnosis not present

## 2021-12-12 DIAGNOSIS — D0511 Intraductal carcinoma in situ of right breast: Secondary | ICD-10-CM | POA: Diagnosis not present

## 2021-12-12 DIAGNOSIS — N6341 Unspecified lump in right breast, subareolar: Secondary | ICD-10-CM | POA: Diagnosis not present

## 2021-12-12 DIAGNOSIS — N6315 Unspecified lump in the right breast, overlapping quadrants: Secondary | ICD-10-CM | POA: Diagnosis not present

## 2021-12-12 DIAGNOSIS — R928 Other abnormal and inconclusive findings on diagnostic imaging of breast: Secondary | ICD-10-CM | POA: Diagnosis not present

## 2021-12-12 DIAGNOSIS — N6310 Unspecified lump in the right breast, unspecified quadrant: Secondary | ICD-10-CM | POA: Diagnosis not present

## 2021-12-12 DIAGNOSIS — Z170 Estrogen receptor positive status [ER+]: Secondary | ICD-10-CM | POA: Diagnosis not present

## 2021-12-19 DIAGNOSIS — D0511 Intraductal carcinoma in situ of right breast: Secondary | ICD-10-CM | POA: Diagnosis not present

## 2021-12-25 DIAGNOSIS — Z170 Estrogen receptor positive status [ER+]: Secondary | ICD-10-CM | POA: Diagnosis not present

## 2021-12-25 DIAGNOSIS — C50911 Malignant neoplasm of unspecified site of right female breast: Secondary | ICD-10-CM | POA: Diagnosis not present

## 2021-12-31 DIAGNOSIS — Z801 Family history of malignant neoplasm of trachea, bronchus and lung: Secondary | ICD-10-CM | POA: Diagnosis not present

## 2021-12-31 DIAGNOSIS — Z806 Family history of leukemia: Secondary | ICD-10-CM | POA: Diagnosis not present

## 2021-12-31 DIAGNOSIS — C50919 Malignant neoplasm of unspecified site of unspecified female breast: Secondary | ICD-10-CM | POA: Diagnosis not present

## 2022-01-19 DIAGNOSIS — C50111 Malignant neoplasm of central portion of right female breast: Secondary | ICD-10-CM | POA: Diagnosis not present

## 2022-01-22 DIAGNOSIS — E871 Hypo-osmolality and hyponatremia: Secondary | ICD-10-CM | POA: Diagnosis not present

## 2022-01-22 DIAGNOSIS — R030 Elevated blood-pressure reading, without diagnosis of hypertension: Secondary | ICD-10-CM | POA: Diagnosis not present

## 2022-01-22 DIAGNOSIS — N72 Inflammatory disease of cervix uteri: Secondary | ICD-10-CM | POA: Diagnosis not present

## 2022-01-22 DIAGNOSIS — D0511 Intraductal carcinoma in situ of right breast: Secondary | ICD-10-CM | POA: Diagnosis not present

## 2022-01-22 DIAGNOSIS — M545 Low back pain, unspecified: Secondary | ICD-10-CM | POA: Diagnosis not present

## 2022-01-22 DIAGNOSIS — E782 Mixed hyperlipidemia: Secondary | ICD-10-CM | POA: Diagnosis not present

## 2022-01-22 DIAGNOSIS — R053 Chronic cough: Secondary | ICD-10-CM | POA: Diagnosis not present

## 2022-01-22 DIAGNOSIS — E559 Vitamin D deficiency, unspecified: Secondary | ICD-10-CM | POA: Diagnosis not present

## 2022-02-19 DIAGNOSIS — Z79899 Other long term (current) drug therapy: Secondary | ICD-10-CM | POA: Diagnosis not present

## 2022-02-19 DIAGNOSIS — Z170 Estrogen receptor positive status [ER+]: Secondary | ICD-10-CM | POA: Diagnosis not present

## 2022-02-19 DIAGNOSIS — N6031 Fibrosclerosis of right breast: Secondary | ICD-10-CM | POA: Diagnosis not present

## 2022-02-19 DIAGNOSIS — C50111 Malignant neoplasm of central portion of right female breast: Secondary | ICD-10-CM | POA: Diagnosis not present

## 2022-02-19 DIAGNOSIS — Z888 Allergy status to other drugs, medicaments and biological substances status: Secondary | ICD-10-CM | POA: Diagnosis not present

## 2022-02-19 DIAGNOSIS — C50911 Malignant neoplasm of unspecified site of right female breast: Secondary | ICD-10-CM | POA: Diagnosis not present

## 2022-02-19 DIAGNOSIS — Z6841 Body Mass Index (BMI) 40.0 and over, adult: Secondary | ICD-10-CM | POA: Diagnosis not present

## 2022-02-19 DIAGNOSIS — D0511 Intraductal carcinoma in situ of right breast: Secondary | ICD-10-CM | POA: Diagnosis not present

## 2022-02-19 DIAGNOSIS — E782 Mixed hyperlipidemia: Secondary | ICD-10-CM | POA: Diagnosis not present

## 2022-02-19 DIAGNOSIS — C50211 Malignant neoplasm of upper-inner quadrant of right female breast: Secondary | ICD-10-CM | POA: Diagnosis not present

## 2022-02-19 DIAGNOSIS — Z880 Allergy status to penicillin: Secondary | ICD-10-CM | POA: Diagnosis not present

## 2022-02-20 DIAGNOSIS — C50211 Malignant neoplasm of upper-inner quadrant of right female breast: Secondary | ICD-10-CM | POA: Diagnosis not present

## 2022-02-20 DIAGNOSIS — Z6841 Body Mass Index (BMI) 40.0 and over, adult: Secondary | ICD-10-CM | POA: Diagnosis not present

## 2022-02-20 DIAGNOSIS — Z79899 Other long term (current) drug therapy: Secondary | ICD-10-CM | POA: Diagnosis not present

## 2022-02-20 DIAGNOSIS — E782 Mixed hyperlipidemia: Secondary | ICD-10-CM | POA: Diagnosis not present

## 2022-02-20 DIAGNOSIS — Z170 Estrogen receptor positive status [ER+]: Secondary | ICD-10-CM | POA: Diagnosis not present

## 2022-02-20 DIAGNOSIS — Z880 Allergy status to penicillin: Secondary | ICD-10-CM | POA: Diagnosis not present

## 2022-02-20 DIAGNOSIS — N6031 Fibrosclerosis of right breast: Secondary | ICD-10-CM | POA: Diagnosis not present

## 2022-02-20 DIAGNOSIS — Z888 Allergy status to other drugs, medicaments and biological substances status: Secondary | ICD-10-CM | POA: Diagnosis not present

## 2022-03-16 DIAGNOSIS — Z6841 Body Mass Index (BMI) 40.0 and over, adult: Secondary | ICD-10-CM | POA: Diagnosis not present

## 2022-03-16 DIAGNOSIS — E7800 Pure hypercholesterolemia, unspecified: Secondary | ICD-10-CM | POA: Diagnosis not present

## 2022-03-16 DIAGNOSIS — F32A Depression, unspecified: Secondary | ICD-10-CM | POA: Diagnosis not present

## 2022-03-20 DIAGNOSIS — Z9889 Other specified postprocedural states: Secondary | ICD-10-CM | POA: Diagnosis not present

## 2022-03-20 DIAGNOSIS — Z170 Estrogen receptor positive status [ER+]: Secondary | ICD-10-CM | POA: Diagnosis not present

## 2022-03-20 DIAGNOSIS — C50811 Malignant neoplasm of overlapping sites of right female breast: Secondary | ICD-10-CM | POA: Diagnosis not present

## 2022-03-27 DIAGNOSIS — C50811 Malignant neoplasm of overlapping sites of right female breast: Secondary | ICD-10-CM | POA: Diagnosis not present

## 2022-03-27 DIAGNOSIS — Z170 Estrogen receptor positive status [ER+]: Secondary | ICD-10-CM | POA: Diagnosis not present

## 2022-03-31 DIAGNOSIS — N9089 Other specified noninflammatory disorders of vulva and perineum: Secondary | ICD-10-CM | POA: Diagnosis not present

## 2022-03-31 DIAGNOSIS — Z170 Estrogen receptor positive status [ER+]: Secondary | ICD-10-CM | POA: Diagnosis not present

## 2022-03-31 DIAGNOSIS — N904 Leukoplakia of vulva: Secondary | ICD-10-CM | POA: Diagnosis not present

## 2022-03-31 DIAGNOSIS — C50811 Malignant neoplasm of overlapping sites of right female breast: Secondary | ICD-10-CM | POA: Diagnosis not present

## 2022-04-21 DIAGNOSIS — Z9189 Other specified personal risk factors, not elsewhere classified: Secondary | ICD-10-CM | POA: Diagnosis not present

## 2022-04-21 DIAGNOSIS — Z170 Estrogen receptor positive status [ER+]: Secondary | ICD-10-CM | POA: Diagnosis not present

## 2022-04-21 DIAGNOSIS — C50811 Malignant neoplasm of overlapping sites of right female breast: Secondary | ICD-10-CM | POA: Diagnosis not present

## 2022-04-21 DIAGNOSIS — Z9011 Acquired absence of right breast and nipple: Secondary | ICD-10-CM | POA: Diagnosis not present

## 2022-04-21 DIAGNOSIS — Z79811 Long term (current) use of aromatase inhibitors: Secondary | ICD-10-CM | POA: Diagnosis not present

## 2022-04-21 DIAGNOSIS — Z5181 Encounter for therapeutic drug level monitoring: Secondary | ICD-10-CM | POA: Diagnosis not present

## 2022-04-28 DIAGNOSIS — N9089 Other specified noninflammatory disorders of vulva and perineum: Secondary | ICD-10-CM | POA: Diagnosis not present

## 2022-04-28 DIAGNOSIS — N904 Leukoplakia of vulva: Secondary | ICD-10-CM | POA: Diagnosis not present

## 2022-05-08 DIAGNOSIS — R5383 Other fatigue: Secondary | ICD-10-CM | POA: Diagnosis not present

## 2022-05-08 DIAGNOSIS — M170 Bilateral primary osteoarthritis of knee: Secondary | ICD-10-CM | POA: Diagnosis not present

## 2022-05-08 DIAGNOSIS — Z6841 Body Mass Index (BMI) 40.0 and over, adult: Secondary | ICD-10-CM | POA: Diagnosis not present

## 2022-05-08 DIAGNOSIS — Z170 Estrogen receptor positive status [ER+]: Secondary | ICD-10-CM | POA: Diagnosis not present

## 2022-05-08 DIAGNOSIS — C50811 Malignant neoplasm of overlapping sites of right female breast: Secondary | ICD-10-CM | POA: Diagnosis not present

## 2022-05-08 DIAGNOSIS — G8929 Other chronic pain: Secondary | ICD-10-CM | POA: Diagnosis not present

## 2022-05-08 DIAGNOSIS — M545 Low back pain, unspecified: Secondary | ICD-10-CM | POA: Diagnosis not present

## 2022-06-03 DIAGNOSIS — N62 Hypertrophy of breast: Secondary | ICD-10-CM | POA: Diagnosis not present

## 2022-06-03 DIAGNOSIS — Z853 Personal history of malignant neoplasm of breast: Secondary | ICD-10-CM | POA: Diagnosis not present

## 2022-06-03 DIAGNOSIS — Z421 Encounter for breast reconstruction following mastectomy: Secondary | ICD-10-CM | POA: Diagnosis not present

## 2022-06-03 DIAGNOSIS — Z9011 Acquired absence of right breast and nipple: Secondary | ICD-10-CM | POA: Diagnosis not present

## 2022-06-03 DIAGNOSIS — Z888 Allergy status to other drugs, medicaments and biological substances status: Secondary | ICD-10-CM | POA: Diagnosis not present

## 2022-06-03 DIAGNOSIS — Z9013 Acquired absence of bilateral breasts and nipples: Secondary | ICD-10-CM | POA: Diagnosis not present

## 2022-06-03 DIAGNOSIS — L570 Actinic keratosis: Secondary | ICD-10-CM | POA: Diagnosis not present

## 2022-06-03 DIAGNOSIS — L821 Other seborrheic keratosis: Secondary | ICD-10-CM | POA: Diagnosis not present

## 2022-06-03 DIAGNOSIS — Z881 Allergy status to other antibiotic agents status: Secondary | ICD-10-CM | POA: Diagnosis not present

## 2022-06-03 DIAGNOSIS — E782 Mixed hyperlipidemia: Secondary | ICD-10-CM | POA: Diagnosis not present

## 2022-06-03 DIAGNOSIS — N651 Disproportion of reconstructed breast: Secondary | ICD-10-CM | POA: Diagnosis not present

## 2022-06-12 DIAGNOSIS — Z6841 Body Mass Index (BMI) 40.0 and over, adult: Secondary | ICD-10-CM | POA: Diagnosis not present

## 2022-06-12 DIAGNOSIS — E7800 Pure hypercholesterolemia, unspecified: Secondary | ICD-10-CM | POA: Diagnosis not present

## 2022-06-19 DIAGNOSIS — K449 Diaphragmatic hernia without obstruction or gangrene: Secondary | ICD-10-CM | POA: Diagnosis not present

## 2022-06-19 DIAGNOSIS — Z5986 Financial insecurity: Secondary | ICD-10-CM | POA: Diagnosis not present

## 2022-06-19 DIAGNOSIS — K436 Other and unspecified ventral hernia with obstruction, without gangrene: Secondary | ICD-10-CM | POA: Diagnosis not present

## 2022-06-19 DIAGNOSIS — R112 Nausea with vomiting, unspecified: Secondary | ICD-10-CM | POA: Diagnosis not present

## 2022-06-19 DIAGNOSIS — T8130XA Disruption of wound, unspecified, initial encounter: Secondary | ICD-10-CM | POA: Diagnosis not present

## 2022-06-19 DIAGNOSIS — N898 Other specified noninflammatory disorders of vagina: Secondary | ICD-10-CM | POA: Diagnosis not present

## 2022-06-24 DIAGNOSIS — K219 Gastro-esophageal reflux disease without esophagitis: Secondary | ICD-10-CM | POA: Diagnosis not present

## 2022-06-24 DIAGNOSIS — K449 Diaphragmatic hernia without obstruction or gangrene: Secondary | ICD-10-CM | POA: Diagnosis not present

## 2022-06-24 DIAGNOSIS — R1012 Left upper quadrant pain: Secondary | ICD-10-CM | POA: Diagnosis not present

## 2022-07-13 DIAGNOSIS — Z1211 Encounter for screening for malignant neoplasm of colon: Secondary | ICD-10-CM | POA: Diagnosis not present

## 2022-07-13 DIAGNOSIS — K219 Gastro-esophageal reflux disease without esophagitis: Secondary | ICD-10-CM | POA: Diagnosis not present

## 2022-07-13 DIAGNOSIS — D124 Benign neoplasm of descending colon: Secondary | ICD-10-CM | POA: Diagnosis not present

## 2022-07-13 DIAGNOSIS — K3189 Other diseases of stomach and duodenum: Secondary | ICD-10-CM | POA: Diagnosis not present

## 2022-07-13 DIAGNOSIS — K449 Diaphragmatic hernia without obstruction or gangrene: Secondary | ICD-10-CM | POA: Diagnosis not present

## 2022-07-13 DIAGNOSIS — D127 Benign neoplasm of rectosigmoid junction: Secondary | ICD-10-CM | POA: Diagnosis not present

## 2022-07-13 DIAGNOSIS — R1010 Upper abdominal pain, unspecified: Secondary | ICD-10-CM | POA: Diagnosis not present

## 2022-07-13 DIAGNOSIS — R110 Nausea: Secondary | ICD-10-CM | POA: Diagnosis not present

## 2022-07-13 DIAGNOSIS — D120 Benign neoplasm of cecum: Secondary | ICD-10-CM | POA: Diagnosis not present

## 2022-07-16 DIAGNOSIS — Z09 Encounter for follow-up examination after completed treatment for conditions other than malignant neoplasm: Secondary | ICD-10-CM | POA: Diagnosis not present

## 2022-07-16 DIAGNOSIS — R1010 Upper abdominal pain, unspecified: Secondary | ICD-10-CM | POA: Diagnosis not present

## 2022-07-16 DIAGNOSIS — R110 Nausea: Secondary | ICD-10-CM | POA: Diagnosis not present

## 2022-07-22 DIAGNOSIS — Z1231 Encounter for screening mammogram for malignant neoplasm of breast: Secondary | ICD-10-CM | POA: Diagnosis not present

## 2022-07-22 DIAGNOSIS — Z79811 Long term (current) use of aromatase inhibitors: Secondary | ICD-10-CM | POA: Diagnosis not present

## 2022-07-22 DIAGNOSIS — Z170 Estrogen receptor positive status [ER+]: Secondary | ICD-10-CM | POA: Diagnosis not present

## 2022-07-22 DIAGNOSIS — Z5181 Encounter for therapeutic drug level monitoring: Secondary | ICD-10-CM | POA: Diagnosis not present

## 2022-07-22 DIAGNOSIS — C50811 Malignant neoplasm of overlapping sites of right female breast: Secondary | ICD-10-CM | POA: Diagnosis not present

## 2022-07-22 DIAGNOSIS — Z9189 Other specified personal risk factors, not elsewhere classified: Secondary | ICD-10-CM | POA: Diagnosis not present

## 2022-07-23 DIAGNOSIS — K449 Diaphragmatic hernia without obstruction or gangrene: Secondary | ICD-10-CM | POA: Diagnosis not present

## 2022-07-23 DIAGNOSIS — R1013 Epigastric pain: Secondary | ICD-10-CM | POA: Diagnosis not present

## 2022-08-04 DIAGNOSIS — Z5181 Encounter for therapeutic drug level monitoring: Secondary | ICD-10-CM | POA: Diagnosis not present

## 2022-08-04 DIAGNOSIS — Z9189 Other specified personal risk factors, not elsewhere classified: Secondary | ICD-10-CM | POA: Diagnosis not present

## 2022-08-04 DIAGNOSIS — M85851 Other specified disorders of bone density and structure, right thigh: Secondary | ICD-10-CM | POA: Diagnosis not present

## 2022-08-04 DIAGNOSIS — Z79811 Long term (current) use of aromatase inhibitors: Secondary | ICD-10-CM | POA: Diagnosis not present

## 2022-09-28 DIAGNOSIS — Z0000 Encounter for general adult medical examination without abnormal findings: Secondary | ICD-10-CM | POA: Diagnosis not present

## 2022-09-28 DIAGNOSIS — M25562 Pain in left knee: Secondary | ICD-10-CM | POA: Diagnosis not present

## 2022-09-28 DIAGNOSIS — C50811 Malignant neoplasm of overlapping sites of right female breast: Secondary | ICD-10-CM | POA: Diagnosis not present

## 2022-09-28 DIAGNOSIS — M25511 Pain in right shoulder: Secondary | ICD-10-CM | POA: Diagnosis not present

## 2022-09-28 DIAGNOSIS — Z6841 Body Mass Index (BMI) 40.0 and over, adult: Secondary | ICD-10-CM | POA: Diagnosis not present

## 2022-09-28 DIAGNOSIS — E559 Vitamin D deficiency, unspecified: Secondary | ICD-10-CM | POA: Diagnosis not present

## 2022-09-28 DIAGNOSIS — K449 Diaphragmatic hernia without obstruction or gangrene: Secondary | ICD-10-CM | POA: Diagnosis not present

## 2022-09-28 DIAGNOSIS — Z87442 Personal history of urinary calculi: Secondary | ICD-10-CM | POA: Diagnosis not present

## 2022-09-28 DIAGNOSIS — E782 Mixed hyperlipidemia: Secondary | ICD-10-CM | POA: Diagnosis not present

## 2022-10-01 DIAGNOSIS — E7800 Pure hypercholesterolemia, unspecified: Secondary | ICD-10-CM | POA: Diagnosis not present

## 2022-10-01 DIAGNOSIS — F32A Depression, unspecified: Secondary | ICD-10-CM | POA: Diagnosis not present

## 2022-10-01 DIAGNOSIS — E559 Vitamin D deficiency, unspecified: Secondary | ICD-10-CM | POA: Diagnosis not present

## 2022-10-01 DIAGNOSIS — Z6841 Body Mass Index (BMI) 40.0 and over, adult: Secondary | ICD-10-CM | POA: Diagnosis not present

## 2022-10-01 DIAGNOSIS — E039 Hypothyroidism, unspecified: Secondary | ICD-10-CM | POA: Diagnosis not present

## 2022-10-05 DIAGNOSIS — N904 Leukoplakia of vulva: Secondary | ICD-10-CM | POA: Diagnosis not present

## 2022-10-07 DIAGNOSIS — R221 Localized swelling, mass and lump, neck: Secondary | ICD-10-CM | POA: Diagnosis not present

## 2022-10-07 DIAGNOSIS — R918 Other nonspecific abnormal finding of lung field: Secondary | ICD-10-CM | POA: Diagnosis not present

## 2022-10-07 DIAGNOSIS — K449 Diaphragmatic hernia without obstruction or gangrene: Secondary | ICD-10-CM | POA: Diagnosis not present

## 2022-11-13 ENCOUNTER — Other Ambulatory Visit: Payer: Self-pay

## 2022-11-13 ENCOUNTER — Ambulatory Visit (HOSPITAL_COMMUNITY): Payer: Medicare HMO | Attending: Physician Assistant

## 2022-11-13 DIAGNOSIS — M79605 Pain in left leg: Secondary | ICD-10-CM | POA: Diagnosis present

## 2022-11-13 DIAGNOSIS — M545 Low back pain, unspecified: Secondary | ICD-10-CM | POA: Insufficient documentation

## 2022-11-13 DIAGNOSIS — M79604 Pain in right leg: Secondary | ICD-10-CM | POA: Insufficient documentation

## 2022-11-13 NOTE — Therapy (Signed)
OUTPATIENT PHYSICAL THERAPY LUMBAR AND LOWER EXTREMITY EVALUATION   Patient Name: Kaitlyn Williamson MRN: [id number] DOB:02-May-1950, 72 y.o., female Today's Date: 11/13/2022  END OF SESSION:  PT End of Session - 11/13/22 1121     Visit Number 1    Number of Visits 4    Date for PT Re-Evaluation 12/11/22    Authorization Type [employer] H[employer] please check auth    PT Start Time 1116    PT Stop Time 1156    PT Time Calculation (min) 40 min    Activity Tolerance Patient tolerated treatment well    Behavior During Therapy WFL for tasks assessed/performed             Past Medical History:  Diagnosis Date   Arthritis    Gallstones    Helicobacter pylori (H. pylori) infection    Peptic ulcer    Reflux    Renal disorder    Past Surgical History:  Procedure Laterality Date   INSERTION OF MESH  03/23/2014   Procedure: INSERTION OF MESH;  Surgeon: Dalia Heading, MD;  Location: [facility];  Service: General;;   LITHOTRIPSY     VENTRAL HERNIA REPAIR  03/23/2014   Procedure: VENTRAL HERNIORRHAPHY;  Surgeon: Dalia Heading, MD;  Location: [facility];  Service: General;;   Patient Active Problem List   Diagnosis Date Noted   Incarcerated ventral hernia 03/23/2014    PCP: Julien Girt, PA-C  REFERRING PROVIDER: Julien Girt, PA-C  REFERRING DIAG: M25.511 (ICD-10-CM) - Pain in right shoulder G89.29 (ICD-10-CM) - Other chronic pain M25.562 (ICD-10-CM) - Pain in left knee  THERAPY DIAG:  Low back pain, unspecified back pain laterality, unspecified chronicity, unspecified whether sciatica present  Pain in left leg  Pain in right leg  Rationale for Evaluation and Treatment: Rehabilitation  ONSET DATE: Chronic back and knee pain  SUBJECTIVE:   SUBJECTIVE STATEMENT: Patient with chronic back and leg/knee pain.  Left knee needs TKA.  Saw Kirstin Shepperson who referred to therapy.  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 Yes: NPRS scale: 8/10 Pain location: left knee, right knee and low back Pain description: sore, aching, burning Aggravating factors: standing, walking Relieving factors: rest, sitting  PRECAUTIONS: None    WEIGHT BEARING RESTRICTIONS: No  FALLS:  Has patient fallen in last 6 months? No    OCCUPATION: retired  PLOF: Independent  PATIENT GOALS: be able to walk without having to stop all the time   NEXT MD VISIT: 2 weeks sees orthopedic regarding left knee  OBJECTIVE:   DIAGNOSTIC FINDINGS: none found in EPIC  PATIENT SURVEYS:  FOTO 47 (knee)  COGNITION: Overall cognitive status: Within functional limits for tasks assessed     SENSATION: Hands have some N/T somtimes  EDEMA:  Feet swell per patient report; left knee  POSTURE: rounded shoulders, forward head, and weight shift right  PALPATION: Left knee valgus  LUMBAR ROM:   Active  A/PROM  eval  Flexion full  Extension 70% available   Right lateral flexion To knee joint line  Left lateral flexion To knee joint line  Right rotation   Left rotation    (Blank rows = not tested)  LOWER EXTREMITY ROM:  Active ROM Right eval Left eval  Hip flexion  Hip extension    Hip abduction    Hip adduction    Hip internal rotation    Hip external rotation    Knee flexion 115 118  Knee extension    Ankle dorsiflexion    Ankle plantarflexion    Ankle inversion    Ankle eversion     (Blank rows = not tested)  LOWER EXTREMITY MMT:  MMT Right eval Left eval  Hip flexion 5 4+  Hip extension    Hip abduction    Hip adduction    Hip internal rotation    Hip external rotation    Knee flexion    Knee extension 4+ popping noted 4+ Left knee valgus  Ankle dorsiflexion 5 5  Ankle plantarflexion    Ankle inversion    Ankle eversion     (Blank rows = not  tested)   FUNCTIONAL TESTS:  5 times sit to stand: 20.98 sec hands on knees 2 minute walk test: 314 ft no AD  GAIT: Distance walked: 314 ft Assistive device utilized: None Level of assistance: Modified independence Comments: left knee recurvatum; limited toe off on left; antalgic gait decreased stance left leg.   TODAY'S TREATMENT:                                                                                                                              DATE: 11/13/22 physical therapy evaluation and HEP instruction    PATIENT EDUCATION:  Education details: Patient educated on exam findings, POC, scope of PT, HEP, and what to expect next visit. Person educated: Patient Education method: Explanation, Demonstration, and Handouts Education comprehension: verbalized understanding, returned demonstration, verbal cues required, and tactile cues required  HOME EXERCISE PROGRAM: Access Code: [email or web address] URL: [email or web address] Date: 11/13/2022 Prepared by: [facility]  Exercises - Supine Lower Trunk Rotation  - 2 x daily - 7 x weekly - 1 sets - 10 reps - Supine Transversus Abdominis Bracing - Hands on Stomach  - 2 x daily - 7 x weekly - 1 sets - 10 reps - Supine Quad Set  - 2 x daily - 7 x weekly - 1 sets - 10 reps  ASSESSMENT:  CLINICAL IMPRESSION: Patient is a 72 y.o. female who was seen today for physical therapy evaluation and treatment for M25.511 (ICD-10-CM) - Pain in right shoulder G89.29 (ICD-10-CM) - Other chronic pain M25.562 (ICD-10-CM) - Pain in left knee. Patient demonstrates muscle weakness, reduced ROM, and fascial restrictions which are likely contributing to symptoms of pain and are negatively impacting patient ability to perform ADLs and functional mobility tasks. Patient will benefit from skilled physical therapy services to address these deficits to reduce pain and improve level of function with ADLs and functional mobility tasks.   OBJECTIVE  IMPAIRMENTS: Abnormal gait, decreased activity tolerance, decreased endurance, decreased mobility, difficulty walking, decreased ROM, decreased strength, hypomobility, increased fascial restrictions, impaired perceived functional ability, and pain.   ACTIVITY LIMITATIONS: carrying, lifting, bending,  standing, squatting, reach over head, and locomotion level  PARTICIPATION LIMITATIONS: meal prep, cleaning, laundry, shopping, community activity, and yard work  [facility] POTENTIAL: Good  CLINICAL DECISION MAKING: Stable/uncomplicated  EVALUATION COMPLEXITY: Moderate   GOALS: Goals reviewed with patient? No  SHORT TERM GOALS: Target date: 11/27/2022 patient will be independent with initial HEP  Baseline: Goal status: INITIAL  2.  Patient will self report 30% improvement to improve tolerance for functional activity  Baseline:  Goal status: INITIAL  LONG TERM GOALS: Target date: 12/11/2022  Patient will be independent in self management strategies to improve quality of life and functional outcomes.   Baseline:  Goal status: INITIAL  2.  Patient will self report 50% improvement to improve tolerance for functional activity  Baseline:  Goal status: INITIAL  3.  Patient will increase distance on to 340 ft  to demonstrate improved functional mobility walking household and community distances.   Baseline: 314 ft Goal status: INITIAL  4.  Patient will improve 5 times sit to stand score from 20.98 sec to 18 sec to demonstrate improved functional mobility and increased lower extremity strength.  Baseline:  Goal status: INITIAL  5.  Patient will improve FOTO score by 10 points to demonstrate improved perceived functional mobility  Baseline: 47 Goal status: INITIAL    PLAN:  PT FREQUENCY: 1x/week  PT DURATION: 4 weeks  PLANNED INTERVENTIONS: Therapeutic exercises, Therapeutic activity, Neuromuscular re-education, Balance training, Gait training, Patient/Family education,  Joint manipulation, Joint mobilization, Stair training, Orthotic/Fit training, DME instructions, Aquatic Therapy, Dry Needling, Electrical stimulation, Spinal manipulation, Spinal mobilization, Cryotherapy, Moist heat, Compression bandaging, scar mobilization, Splintting, Taping, Traction, Ultrasound, Ionotophoresis 4mg /ml Dexamethasone, and Manual therapy   PLAN FOR NEXT SESSION: Review HEP and goals; patient with co-pay so coming 1 x a week; progress HEP each visit   12:00 PM, 11/13/22 Anyah Swallow Small Tamarick Kovalcik MPT [place] [facility] [place] [phone] [phone]

## 2022-11-20 ENCOUNTER — Encounter (HOSPITAL_COMMUNITY): Payer: Medicare HMO | Admitting: Physical Therapy

## 2022-11-27 ENCOUNTER — Encounter (HOSPITAL_COMMUNITY): Payer: Medicare HMO

## 2022-12-03 ENCOUNTER — Encounter (HOSPITAL_COMMUNITY): Payer: Self-pay | Admitting: Physical Therapy

## 2022-12-03 ENCOUNTER — Ambulatory Visit (HOSPITAL_COMMUNITY): Payer: Medicare HMO | Attending: Physician Assistant | Admitting: Physical Therapy

## 2022-12-03 DIAGNOSIS — M79604 Pain in right leg: Secondary | ICD-10-CM | POA: Insufficient documentation

## 2022-12-03 DIAGNOSIS — M79605 Pain in left leg: Secondary | ICD-10-CM | POA: Diagnosis present

## 2022-12-03 DIAGNOSIS — M545 Low back pain, unspecified: Secondary | ICD-10-CM | POA: Insufficient documentation

## 2022-12-03 NOTE — Therapy (Signed)
OUTPATIENT PHYSICAL THERAPY TREATMENT   Patient Name: Kaitlyn Williamson MRN: [id number] DOB:12-26-1950, 72 y.o., female Today's Date: 12/03/2022  END OF SESSION:  PT End of Session - 12/03/22 0736     Visit Number 2    Number of Visits 4    Date for PT Re-Evaluation 12/11/22    Authorization Type [employer] H[employer]    Authorization Time Period approved 4 visits and 1 reeval 11/13/22 -12/11/22    Authorization - Visit Number 2    Authorization - Number of Visits 4    PT Start Time 0736    PT Stop Time 0814    PT Time Calculation (min) 38 min    Activity Tolerance Patient tolerated treatment well    Behavior During Therapy WFL for tasks assessed/performed             Past Medical History:  Diagnosis Date   Arthritis    Gallstones    Helicobacter pylori (H. pylori) infection    Peptic ulcer    Reflux    Renal disorder    Past Surgical History:  Procedure Laterality Date   INSERTION OF MESH  03/23/2014   Procedure: INSERTION OF MESH;  Surgeon: Dalia Heading, MD;  Location: [facility];  Service: General;;   LITHOTRIPSY     VENTRAL HERNIA REPAIR  03/23/2014   Procedure: VENTRAL HERNIORRHAPHY;  Surgeon: Dalia Heading, MD;  Location: [facility];  Service: General;;   Patient Active Problem List   Diagnosis Date Noted   Incarcerated ventral hernia 03/23/2014    PCP: Julien Girt, PA-C  REFERRING PROVIDER: Julien Girt, PA-C  REFERRING DIAG: M25.511 (ICD-10-CM) - Pain in right shoulder G89.29 (ICD-10-CM) - Other chronic pain M25.562 (ICD-10-CM) - Pain in left knee  THERAPY DIAG:  Low back pain, unspecified back pain laterality, unspecified chronicity, unspecified whether sciatica present  Pain in left leg  Pain in right leg  Rationale for Evaluation and Treatment: Rehabilitation  ONSET DATE: Chronic back and knee pain  SUBJECTIVE:   SUBJECTIVE STATEMENT: Patient states having a rough week. Some dizziness this morning. Been trying to do HEP but they  hurt. Left knee is bothering her most today.    EVAL: Patient with chronic back and leg/knee pain.  Left knee needs TKA.  Saw Kirstin Shepperson who referred to therapy.  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 Yes: NPRS scale: 3-4/10 Pain location: left knee, right knee and low back Pain description: sore, aching, burning Aggravating factors: standing, walking Relieving factors: rest, sitting  PRECAUTIONS: None    WEIGHT BEARING RESTRICTIONS: No  FALLS:  Has patient fallen in last 6 months? No    OCCUPATION: retired  PLOF: Independent  PATIENT GOALS: be able to walk without having to stop all the time   NEXT MD VISIT: 2 weeks sees orthopedic regarding left knee  OBJECTIVE:   DIAGNOSTIC FINDINGS: none found in EPIC  PATIENT SURVEYS:  FOTO 47 (knee)  COGNITION: Overall cognitive status: Within functional limits for tasks assessed     SENSATION: Hands have some N/T somtimes  EDEMA:  Feet swell per patient report; left knee  POSTURE: rounded shoulders, forward head, and weight shift right  PALPATION: Left knee valgus  LUMBAR ROM:   Active  A/PROM  eval  Flexion full  Extension 70% available   Right lateral flexion To knee joint line  Left lateral flexion To knee joint line  Right rotation   Left rotation    (Blank rows = not tested)  LOWER EXTREMITY ROM:  Active ROM Right eval Left eval  Hip flexion    Hip extension    Hip abduction    Hip adduction    Hip internal rotation    Hip external rotation    Knee flexion 115 118  Knee extension    Ankle dorsiflexion    Ankle plantarflexion    Ankle inversion    Ankle eversion     (Blank rows = not tested)  LOWER EXTREMITY MMT:  MMT Right eval Left eval  Hip flexion 5 4+  Hip extension    Hip abduction     Hip adduction    Hip internal rotation    Hip external rotation    Knee flexion    Knee extension 4+ popping noted 4+ Left knee valgus  Ankle dorsiflexion 5 5  Ankle plantarflexion    Ankle inversion    Ankle eversion     (Blank rows = not tested)   FUNCTIONAL TESTS:  5 times sit to stand: 20.98 sec hands on knees 2 minute walk test: 314 ft no AD  GAIT: Distance walked: 314 ft Assistive device utilized: None Level of assistance: Modified independence Comments: left knee recurvatum; limited toe off on left; antalgic gait decreased stance left leg.   TODAY'S TREATMENT:                                                                                                                              DATE:  12/03/22 LTR 1 x 10 with 5 second holds AB set 1x 10 with 5 second holds Quad set 10 x 5 second holds bilateral March with ab set 2 x 10 bilateral  SLR 1 x 10 bilateral LAQ 1 x 10 with 5 second holds bilateral Standing hip abduction 1 x 10 bilateral  Standing hip extension 1 x 10 bilateral  Standing HR 2 x 10 Standing TR 2 x 10   11/13/22 physical therapy evaluation and HEP instruction    PATIENT EDUCATION:  Education details: 12/03/22: HEP; EVAL: Patient educated on exam findings, POC, scope of PT, HEP, and what to expect next visit. Person educated: Patient Education method: Explanation, Demonstration, and Handouts Education comprehension: verbalized understanding, returned demonstration, verbal cues required, and tactile cues required  HOME EXERCISE PROGRAM: Access Code: [email or web address] URL: [email or web address]  12/03/22- Supine March  - 1 x daily - 7 x weekly - 2 sets - 10 reps - Active Straight Leg Raise with Quad Set  - 1 x daily - 7 x weekly - 2 sets - 10 reps - Seated Long Arc Quad (Mirrored)  - 1 x daily - 7 x weekly - 1-2 sets - 10 reps - 5 second  hold - Standing Hip Abduction with Counter Support  -  1 x daily - 7 x weekly - 1-2 sets - 10 reps - Standing  Hip Extension with Counter Support  - 1 x daily - 7 x weekly - 1-2 sets - 10 reps - Heel Raises with Counter Support  - 1 x daily - 7 x weekly - 2 sets - 10 reps - Toe Raises with Counter Support  - 1 x daily - 7 x weekly - 2 sets - 10 reps  Date: 11/13/2022 - Supine Lower Trunk Rotation  - 2 x daily - 7 x weekly - 1 sets - 10 reps - Supine Transversus Abdominis Bracing - Hands on Stomach  - 2 x daily - 7 x weekly - 1 sets - 10 reps - Supine Quad Set  - 2 x daily - 7 x weekly - 1 sets - 10 reps  ASSESSMENT:  CLINICAL IMPRESSION: Began session with previously provided HEP exercises which are performed with good mechanics. Patient tolerates additional strengthening exercises well and demonstrates proper mechanics after initial demonstration. Provided cueing for TRA activation with exercises with good carry over. Patient will continue to benefit from physical therapy in order to improve function and reduce impairment.    OBJECTIVE IMPAIRMENTS: Abnormal gait, decreased activity tolerance, decreased endurance, decreased mobility, difficulty walking, decreased ROM, decreased strength, hypomobility, increased fascial restrictions, impaired perceived functional ability, and pain.   ACTIVITY LIMITATIONS: carrying, lifting, bending, standing, squatting, reach over head, and locomotion level  PARTICIPATION LIMITATIONS: meal prep, cleaning, laundry, shopping, community activity, and yard work  [facility] POTENTIAL: Good  CLINICAL DECISION MAKING: Stable/uncomplicated  EVALUATION COMPLEXITY: Moderate   GOALS: Goals reviewed with patient? No  SHORT TERM GOALS: Target date: 11/27/2022 patient will be independent with initial HEP  Baseline: Goal status: INITIAL  2.  Patient will self report 30% improvement to improve tolerance for functional activity  Baseline:  Goal status: INITIAL  LONG TERM GOALS: Target date: 12/11/2022  Patient will be independent in self management strategies to improve  quality of life and functional outcomes.   Baseline:  Goal status: INITIAL  2.  Patient will self report 50% improvement to improve tolerance for functional activity  Baseline:  Goal status: INITIAL  3.  Patient will increase distance on to 340 ft  to demonstrate improved functional mobility walking household and community distances.   Baseline: 314 ft Goal status: INITIAL  4.  Patient will improve 5 times sit to stand score from 20.98 sec to 18 sec to demonstrate improved functional mobility and increased lower extremity strength.  Baseline:  Goal status: INITIAL  5.  Patient will improve FOTO score by 10 points to demonstrate improved perceived functional mobility  Baseline: 47 Goal status: INITIAL    PLAN:  PT FREQUENCY: 1x/week  PT DURATION: 4 weeks  PLANNED INTERVENTIONS: Therapeutic exercises, Therapeutic activity, Neuromuscular re-education, Balance training, Gait training, Patient/Family education, Joint manipulation, Joint mobilization, Stair training, Orthotic/Fit training, DME instructions, Aquatic Therapy, Dry Needling, Electrical stimulation, Spinal manipulation, Spinal mobilization, Cryotherapy, Moist heat, Compression bandaging, scar mobilization, Splintting, Taping, Traction, Ultrasound, Ionotophoresis 4mg /ml Dexamethasone, and Manual therapy   PLAN FOR NEXT SESSION:  patient with co-pay so coming 1 x a week; progress HEP each visit   7:38 AM, 12/03/22 Wyman Songster PT, DPT Physical Therapist at [facility]

## 2022-12-10 ENCOUNTER — Ambulatory Visit (HOSPITAL_COMMUNITY): Payer: Medicare HMO

## 2022-12-10 DIAGNOSIS — M545 Low back pain, unspecified: Secondary | ICD-10-CM | POA: Diagnosis not present

## 2022-12-10 DIAGNOSIS — M79605 Pain in left leg: Secondary | ICD-10-CM

## 2022-12-10 DIAGNOSIS — M79604 Pain in right leg: Secondary | ICD-10-CM

## 2022-12-10 NOTE — Therapy (Signed)
OUTPATIENT PHYSICAL THERAPY DISCHARGE PHYSICAL THERAPY DISCHARGE SUMMARY  Visits from Start of Care: 3  Current functional level related to goals / functional outcomes: See below   Remaining deficits: See below   Education / Equipment: HEP   Patient agrees to discharge. Patient goals were partially met. Patient is being discharged due to maximized rehab potential.  Left knee pain has increased and knee is noticeably more unstable with ambulation    Patient Name: Kaitlyn Williamson MRN: [id number] DOB:1950/07/17, 72 y.o., female Today's Date: 12/10/2022  END OF SESSION:  PT End of Session - 12/10/22 0955     Visit Number 3    Number of Visits 4    Date for PT Re-Evaluation 12/11/22    Authorization Type [employer]    Authorization Time Period approved 4 visits and 1 reeval 11/13/22 -12/11/22    Authorization - Visit Number 3    Authorization - Number of Visits 4    Progress Note Due on Visit 4    PT Start Time [phone]   late check in   PT Stop Time 1020    PT Time Calculation (min) 28 min    Activity Tolerance Patient tolerated treatment well    Behavior During Therapy WFL for tasks assessed/performed             Past Medical History:  Diagnosis Date   Arthritis    Gallstones    Helicobacter pylori (H. pylori) infection    Peptic ulcer    Reflux    Renal disorder    Past Surgical History:  Procedure Laterality Date   INSERTION OF MESH  03/23/2014   Procedure: INSERTION OF MESH;  Surgeon: Dalia Heading, MD;  Location: [facility];  Service: General;;   LITHOTRIPSY     VENTRAL HERNIA REPAIR  03/23/2014   Procedure: VENTRAL HERNIORRHAPHY;  Surgeon: Dalia Heading, MD;  Location: [facility];  Service: General;;   Patient Active Problem List   Diagnosis Date Noted   Incarcerated ventral hernia 03/23/2014    PCP: Julien Girt, PA-C  REFERRING PROVIDER: Julien Girt, PA-C  REFERRING DIAG: M25.511 (ICD-10-CM) - Pain in right shoulder G89.29  (ICD-10-CM) - Other chronic pain M25.562 (ICD-10-CM) - Pain in left knee  THERAPY DIAG:  Low back pain, unspecified back pain laterality, unspecified chronicity, unspecified whether sciatica present  Pain in left leg  Pain in right leg  Rationale for Evaluation and Treatment: Rehabilitation  ONSET DATE: Chronic back and knee pain  SUBJECTIVE:   SUBJECTIVE STATEMENT: Patient reports she has had a really busy week; her left knee is hurting really badly so is putting more pressure on right knee and back; also has hurt her right shoulder with pushing up out of the chair for sit to stand.  Has been compliant with exercise; she does feel stronger.  "20 to 30%" better overall  EVAL: Patient with chronic back and leg/knee pain.  Left knee needs TKA.  Saw Kirstin Shepperson who referred to therapy.  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 Yes: NPRS scale: 3-4/10 Pain location: left knee, right knee and low back Pain description: sore, aching, burning Aggravating factors: standing, walking Relieving factors: rest, sitting  PRECAUTIONS: None    WEIGHT BEARING RESTRICTIONS: No  FALLS:  Has patient fallen  in last 6 months? No    OCCUPATION: retired  PLOF: Independent  PATIENT GOALS: be able to walk without having to stop all the time   NEXT MD VISIT: 2 weeks sees orthopedic regarding left knee  OBJECTIVE:   DIAGNOSTIC FINDINGS: none found in EPIC  PATIENT SURVEYS:  FOTO 47 (knee)  COGNITION: Overall cognitive status: Within functional limits for tasks assessed     SENSATION: Hands have some N/T somtimes  EDEMA:  Feet swell per patient report; left knee  POSTURE: rounded shoulders, forward head, and weight shift right  PALPATION: Left knee valgus  LUMBAR ROM:   Active  A/PROM   eval  Flexion full  Extension 70% available   Right lateral flexion To knee joint line  Left lateral flexion To knee joint line  Right rotation   Left rotation    (Blank rows = not tested)  LOWER EXTREMITY ROM:  Active ROM Right eval Left eval  Hip flexion    Hip extension    Hip abduction    Hip adduction    Hip internal rotation    Hip external rotation    Knee flexion 115 118  Knee extension    Ankle dorsiflexion    Ankle plantarflexion    Ankle inversion    Ankle eversion     (Blank rows = not tested)  LOWER EXTREMITY MMT:  MMT Right eval Left eval  Hip flexion 5 4+  Hip extension    Hip abduction    Hip adduction    Hip internal rotation    Hip external rotation    Knee flexion    Knee extension 4+ popping noted 4+ Left knee valgus  Ankle dorsiflexion 5 5  Ankle plantarflexion    Ankle inversion    Ankle eversion     (Blank rows = not tested)   FUNCTIONAL TESTS:  5 times sit to stand: 20.98 sec hands on knees 2 minute walk test: 314 ft no AD  GAIT: Distance walked: 314 ft Assistive device utilized: None Level of assistance: Modified independence Comments: left knee recurvatum; limited toe off on left; antalgic gait decreased stance left leg.   TODAY'S TREATMENT:                                                                                                                              DATE:  12/10/22 Progress note 5 times sit to stand 10.69 sec 2 MWT unable today due to left knee Left knee severe valgus and hyperextension noted  FOTO 49   12/03/22 LTR 1 x 10 with 5 second holds AB set 1x 10 with 5 second holds Quad set 10 x 5 second holds bilateral March with ab set 2 x 10 bilateral  SLR 1 x 10 bilateral LAQ 1 x 10 with 5 second holds bilateral Standing hip abduction 1 x 10 bilateral  Standing hip extension 1 x 10 bilateral  Standing HR 2 x 10 Standing  TR 2 x 10   11/13/22 physical therapy evaluation and HEP instruction     PATIENT EDUCATION:  Education details: 12/03/22: HEP; EVAL: Patient educated on exam findings, POC, scope of PT, HEP, and what to expect next visit. Person educated: Patient Education method: Explanation, Demonstration, and Handouts Education comprehension: verbalized understanding, returned demonstration, verbal cues required, and tactile cues required  HOME EXERCISE PROGRAM: Access Code: [email or web address] URL: [email or web address]  12/03/22- Supine March  - 1 x daily - 7 x weekly - 2 sets - 10 reps - Active Straight Leg Raise with Quad Set  - 1 x daily - 7 x weekly - 2 sets - 10 reps - Seated Long Arc Quad (Mirrored)  - 1 x daily - 7 x weekly - 1-2 sets - 10 reps - 5 second  hold - Standing Hip Abduction with Counter Support  - 1 x daily - 7 x weekly - 1-2 sets - 10 reps - Standing Hip Extension with Counter Support  - 1 x daily - 7 x weekly - 1-2 sets - 10 reps - Heel Raises with Counter Support  - 1 x daily - 7 x weekly - 2 sets - 10 reps - Toe Raises with Counter Support  - 1 x daily - 7 x weekly - 2 sets - 10 reps  Date: 11/13/2022 - Supine Lower Trunk Rotation  - 2 x daily - 7 x weekly - 1 sets - 10 reps - Supine Transversus Abdominis Bracing - Hands on Stomach  - 2 x daily - 7 x weekly - 1 sets - 10 reps - Supine Quad Set  - 2 x daily - 7 x weekly - 1 sets - 10 reps  ASSESSMENT:  CLINICAL IMPRESSION: Patient with noticeable increased antalgic gait; instability of the left knee with walking; extreme valgus and hyperextension.  Progress note due today.  Some improvement noted with sit to stand test demonstrating increased strength and improved functional mobility. Discussed with patient at length the importance of continuing with her HEP as she awaits much needed left knee surgery and she verbalizes understanding. Patient is agreeable to  Discharge at this time.   OBJECTIVE IMPAIRMENTS: Abnormal gait, decreased activity tolerance, decreased endurance, decreased mobility,  difficulty walking, decreased ROM, decreased strength, hypomobility, increased fascial restrictions, impaired perceived functional ability, and pain.   ACTIVITY LIMITATIONS: carrying, lifting, bending, standing, squatting, reach over head, and locomotion level  PARTICIPATION LIMITATIONS: meal prep, cleaning, laundry, shopping, community activity, and yard work  [facility] POTENTIAL: Good  CLINICAL DECISION MAKING: Stable/uncomplicated  EVALUATION COMPLEXITY: Moderate   GOALS: Goals reviewed with patient? No  SHORT TERM GOALS: Target date: 11/27/2022 patient will be independent with initial HEP  Baseline: Goal status: met  2.  Patient will self report 30% improvement to improve tolerance for functional activity  Baseline:  Goal status: met  LONG TERM GOALS: Target date: 12/11/2022  Patient will be independent in self management strategies to improve quality of life and functional outcomes.   Baseline:  Goal status: INITIAL  2.  Patient will self report 50% improvement to improve tolerance for functional activity  Baseline:  Goal status: INITIAL  3.  Patient will increase distance on to 340 ft  to demonstrate improved functional mobility walking household and community distances.   Baseline: 314 ft Goal status: INITIAL  4.  Patient will improve 5 times sit to stand score from 20.98 sec to 18 sec to demonstrate improved functional mobility and increased lower extremity strength.  Baseline:  Goal status: met  5.  Patient will improve FOTO score by 10 points to demonstrate improved perceived functional mobility  Baseline: 47; 49 12/10/22 Goal status: INITIAL    PLAN:  PT FREQUENCY: 1x/week  PT DURATION: 4 weeks  PLANNED INTERVENTIONS: Therapeutic exercises, Therapeutic activity, Neuromuscular re-education, Balance training, Gait training, Patient/Family education, Joint manipulation, Joint mobilization, Stair training, Orthotic/Fit training, DME instructions,  Aquatic Therapy, Dry Needling, Electrical stimulation, Spinal manipulation, Spinal mobilization, Cryotherapy, Moist heat, Compression bandaging, scar mobilization, Splintting, Taping, Traction, Ultrasound, Ionotophoresis 4mg /ml Dexamethasone, and Manual therapy   PLAN FOR NEXT SESSION: discharge  10:24 AM, 12/10/22  Small  MPT [place] [place] [phone] Ph:[phone]

## 2023-05-19 ENCOUNTER — Other Ambulatory Visit: Payer: Self-pay

## 2023-05-19 ENCOUNTER — Ambulatory Visit (HOSPITAL_COMMUNITY): Payer: Medicare HMO | Attending: Sports Medicine | Admitting: Physical Therapy

## 2023-05-19 DIAGNOSIS — M25662 Stiffness of left knee, not elsewhere classified: Secondary | ICD-10-CM | POA: Insufficient documentation

## 2023-05-19 DIAGNOSIS — M6281 Muscle weakness (generalized): Secondary | ICD-10-CM | POA: Insufficient documentation

## 2023-05-19 DIAGNOSIS — M25562 Pain in left knee: Secondary | ICD-10-CM | POA: Insufficient documentation

## 2023-05-19 DIAGNOSIS — R262 Difficulty in walking, not elsewhere classified: Secondary | ICD-10-CM | POA: Diagnosis present

## 2023-05-19 NOTE — Therapy (Addendum)
OUTPATIENT PHYSICAL THERAPY LOWER EXTREMITY EVALUATION   Patient Name: Kaitlyn Williamson MRN: [id number] DOB:1950-05-21, 73 y.o., female Today's Date: 05/19/2023  END OF SESSION:  PT End of Session - 05/19/23 1112     Visit Number 1    Number of Visits 12    Date for PT Re-Evaluation 06/30/23    Authorization Type [employer]    Progress Note Due on Visit 10    PT Start Time 0930    PT Stop Time 1015    PT Time Calculation (min) 45 min    Activity Tolerance Patient tolerated treatment well             Past Medical History:  Diagnosis Date   Arthritis    Gallstones    Helicobacter pylori (H. pylori) infection    Peptic ulcer    Reflux    Renal disorder    Past Surgical History:  Procedure Laterality Date   INSERTION OF MESH  03/23/2014   Procedure: INSERTION OF MESH;  Surgeon: Dalia Heading, MD;  Location: [facility];  Service: General;;   LITHOTRIPSY     VENTRAL HERNIA REPAIR  03/23/2014   Procedure: VENTRAL HERNIORRHAPHY;  Surgeon: Dalia Heading, MD;  Location: [facility];  Service: General;;   Patient Active Problem List   Diagnosis Date Noted   Incarcerated ventral hernia 03/23/2014    PCP: [facility]  REFERRING PROVIDER: Elige Ko., MD  REFERRING DIAG: LEFT KNEE TOTAL 04/02/2023  THERAPY DIAG:  Left knee pain Muscle weakness Left knee stiffness  Rationale for Evaluation and Treatment: Rehabilitation  ONSET DATE: 04/01/24  SUBJECTIVE:   SUBJECTIVE STATEMENT:   Pt had a Lt knee replacement on 04/01/24, she has been receiving home health therapy.  Her right on is actually bothering her more than her left.  She is still having pain and stiffness.  She is able to sit for 60 minutes, Stand for 15 minutes  but this is due to her back and not so much her knee and walk for less than 5 minutes more do to her back than her knee .  PT states that she sleeps in a recliner due to her back pain.  Pt would like to be able to sit on the floor again.  Pt is not driving  due to not being able to get into her SUV yet.   PERTINENT HISTORY: OA PAIN:  Are you having pain? Yes: NPRS scale: 2; worst 5; best 0 Pain location: left knee back bothers her as well but we will not be addressing this.  Pain description: feels tight  Aggravating factors: activity  Relieving factors: ice, elevate,  PRECAUTIONS: None    WEIGHT BEARING RESTRICTIONS: No  FALLS:  Has patient fallen in last 6 months? No  LIVING ENVIRONMENT: Lives with: lives with their family Lives in: House/apartment Stairs: Yes: Internal: 14 steps; on right going up Has following equipment at home: Single point cane and Walker - 2 wheeled-does not use   OCCUPATION: retired   PLOF: Independent  PATIENT GOALS: Be able to walk better, less swelling, to have less pain.    NEXT MD VISIT: 05/27/23  OBJECTIVE:  Note: Objective measures were completed at Evaluation unless otherwise noted.  COGNITION: Overall cognitive status: Within functional limits for tasks assessed     SENSATION: WFL  EDEMA:  Noted but normal for surgery   LOWER EXTREMITY ROM:  Active ROM Right eval Left eval  Hip flexion    Hip extension  Hip abduction    Hip adduction    Hip internal rotation    Hip external rotation    Knee flexion 122 112  Knee extension 0 1  Ankle dorsiflexion    Ankle plantarflexion    Ankle inversion    Ankle eversion     (Blank rows = not tested)  LOWER EXTREMITY MMT:  MMT Right eval Left eval  Hip flexion 5 5  Hip extension 3 2+  Hip abduction 3 3  Hip adduction    Hip internal rotation    Hip external rotation    Knee flexion 5 4  Knee extension 5 5  Ankle dorsiflexion 5 5  Ankle plantarflexion    Ankle inversion    Ankle eversion     (Blank rows = not tested)   FUNCTIONAL TESTS:  30 seconds chair stand test:  13; average 12, good is 83 for age and sex  2 minute walk test: 166 ft with trendelenburg gt  Single leg stance: Rt: 19 , Lt:  20                                                                                                                                  TREATMENT DATE: 05/19/23 Evaluation: Sit to stand x 10 Quad set x 10  Heel slide x 5 Bridge x 5      PATIENT EDUCATION:  Education details: Self manual pt has HEP from HH= standing marching, heel raise, toe raise, side steps, sitting LAQ, ankle pumps, sit to stand, hip ab/adduction and walking Person educated: Patient Education method: Explanation, Verbal cues, and Handouts Education comprehension: verbalized understanding and returned demonstration  HOME EXERCISE PROGRAM: pt has HEP from HH= standing marching, heel raise, toe raise, side steps, sitting LAQ, ankle pumps, sit to stand, hip ab/adduction and walking Person educated:  Access Code: [email or web address] URL: [email or web address] Date: 05/19/2023 Prepared by: Virgina Organ  Exercises - Supine Bridge  - 2 x daily - 7 x weekly - 1 sets - 10 reps - 5 hold ASSESSMENT:  CLINICAL IMPRESSION: Patient is a 73 y.o. female who was seen today for physical therapy evaluation and treatment for s/p TKR.  Evaluation demonstrates increased edema, increased pain, decreased activity tolerance, decreased balance, decreased ROM and decreased strength.  Ms. Heimann will benefit from skilled PT to address these issues and maximize the pt current functional ability.   OBJECTIVE IMPAIRMENTS: decreased activity tolerance, decreased balance, decreased mobility, difficulty walking, decreased ROM, decreased strength, increased edema, and pain.   ACTIVITY LIMITATIONS: carrying, lifting, bending, sitting, standing, squatting, sleeping, dressing, and locomotion level  PARTICIPATION LIMITATIONS: meal prep, cleaning, laundry, driving, shopping, and community activity   REHAB POTENTIAL: Good  CLINICAL DECISION MAKING: Stable/uncomplicated  EVALUATION COMPLEXITY: Low   GOALS: Goals reviewed with patient? No  SHORT TERM GOALS: Target  date: 2\12/25 Pt to be I in HEP in order to decrease pain to no greater than  Baseline:  Goal status: INITIAL  2.  Pt LT LE mm strength to increase 1/2 grade in order to be able to get into her SUV to drive Baseline:  Goal status: INITIAL  3.  Pt to increase her Lt knee ROM to 120 to be able to squat down to get onto the floor Baseline:  Goal status: INITIAL  4.  Pt to be able to single leg stance for  30   seconds in order to reduce her risk of falls  Baseline:  Goal status: INITIAL    LONG TERM GOALS: Target date: 06/30/23  Pt to be I in an advanced HEP in order to decrease pain to no greater than a 1/10 Baseline:  Goal status: INITIAL  2.  Pt LT LE mm strength to increase 1 grade in order to be able to return from being on the floor in order to play with her grandchildren with ease  Baseline:  Goal status: INITIAL  3.  Pt to walk for 10 minutes without difficulty  Goal status: INITIAL  4.  Pt to be able to go up and down 16 steps in a reciprocal manner to get to her bedroom with ease.  Baseline:  Goal status: INITIAL   PLAN:  PT FREQUENCY: 2x/week  PT DURATION: 6 weeks  PLANNED INTERVENTIONS: 97110-Therapeutic exercises, 97530- Therapeutic activity, [id number]- Neuromuscular re-education, 97535- Self Care, and 62130- Manual therapy  PLAN FOR NEXT SESSION: Continue with [facility] TKR rehab  Virgina Organ, PT CLT [phone]  05/19/2023, 11:12 AM  [employer] Auth Request  Referring diagnosis code (ICD 10)? LEFT KNEE TOTAL Z96.652 Treatment diagnosis codes (ICD 10)  (if different than referring diagnosis) M25.562, M25.662, M62.81, R26.2 What was this (referring dx) caused by? []  Surgery []  Fall []  Ongoing issue [x]  Arthritis []  Other: ____________  Laterality: []  Rt [x]  Lt []  Both  Deficits: [x]  Pain [x]  Stiffness [x]  Weakness [x]  Edema [x]  Balance Deficits []  Coordination [x]  Gait Disturbance [x]  ROM []  Other   Functional Tool Score: 2 minute walk 166 ft  with antalgic gait  CPT codes: See Planned Interventions listed in the Plan section of the Evaluation.

## 2023-05-21 ENCOUNTER — Ambulatory Visit (HOSPITAL_COMMUNITY): Payer: Medicare HMO | Admitting: Physical Therapy

## 2023-05-21 DIAGNOSIS — M25662 Stiffness of left knee, not elsewhere classified: Secondary | ICD-10-CM

## 2023-05-21 DIAGNOSIS — M25562 Pain in left knee: Secondary | ICD-10-CM | POA: Diagnosis not present

## 2023-05-21 DIAGNOSIS — R262 Difficulty in walking, not elsewhere classified: Secondary | ICD-10-CM

## 2023-05-21 DIAGNOSIS — M6281 Muscle weakness (generalized): Secondary | ICD-10-CM

## 2023-05-21 NOTE — Therapy (Signed)
OUTPATIENT PHYSICAL THERAPY LOWER EXTREMITY Treatment  Patient Name: Kaitlyn Williamson MRN: [id number] DOB:10/06/1950, 73 y.o., female Today's Date: 05/21/2023  END OF SESSION:  PT End of Session - 05/21/23 1444     Visit Number 2    Number of Visits 12    Date for PT Re-Evaluation 06/30/23    Authorization Type [employer]    Authorization Time Period 1/24/-3/5    Authorization - Visit Number 2    Authorization - Number of Visits 10    Progress Note Due on Visit 10    PT Start Time 1356    PT Stop Time 1430    PT Time Calculation (min) 34 min    Activity Tolerance Patient tolerated treatment well              Past Medical History:  Diagnosis Date   Arthritis    Gallstones    Helicobacter pylori (H. pylori) infection    Peptic ulcer    Reflux    Renal disorder    Past Surgical History:  Procedure Laterality Date   INSERTION OF MESH  03/23/2014   Procedure: INSERTION OF MESH;  Surgeon: Dalia Heading, MD;  Location: [facility];  Service: General;;   LITHOTRIPSY     VENTRAL HERNIA REPAIR  03/23/2014   Procedure: VENTRAL HERNIORRHAPHY;  Surgeon: Dalia Heading, MD;  Location: [facility];  Service: General;;   Patient Active Problem List   Diagnosis Date Noted   Incarcerated ventral hernia 03/23/2014    PCP: [facility]  REFERRING PROVIDER: Elige Ko., MD  REFERRING DIAG: LEFT KNEE TOTAL 04/02/2023  THERAPY DIAG:  Left knee pain Muscle weakness Left knee stiffness  Rationale for Evaluation and Treatment: Rehabilitation  ONSET DATE: 04/01/24  SUBJECTIVE:   SUBJECTIVE STATEMENT:   Pt states that all her joints are hurting today for some reason.   PT states that she sleeps in a recliner due to her back pain.  Pt would like to be able to sit on the floor again.  Pt is not driving due to not being able to get into her SUV yet.   PERTINENT HISTORY: OA PAIN:  Are you having pain? Yes: NPRS scale: 5; worst 5; best 0 Pain location: left knee back bothers her as  well but we will not be addressing this.  Pain description: feels tight  Aggravating factors: activity  Relieving factors: ice, elevate,  PRECAUTIONS: None    WEIGHT BEARING RESTRICTIONS: No  FALLS:  Has patient fallen in last 6 months? No  LIVING ENVIRONMENT: Lives with: lives with their family Lives in: House/apartment Stairs: Yes: Internal: 14 steps; on right going up Has following equipment at home: Single point cane and Walker - 2 wheeled-does not use   OCCUPATION: retired   PLOF: Independent  PATIENT GOALS: Be able to walk better, less swelling, to have less pain.    NEXT MD VISIT: 05/27/23  OBJECTIVE:  Note: Objective measures were completed at Evaluation unless otherwise noted.  COGNITION: Overall cognitive status: Within functional limits for tasks assessed     SENSATION: WFL  EDEMA:  Noted but normal for surgery   LOWER EXTREMITY ROM:  Active ROM Right eval Left eval  Hip flexion    Hip extension    Hip abduction    Hip adduction    Hip internal rotation    Hip external rotation    Knee flexion 122 112  Knee extension 0 1  Ankle dorsiflexion    Ankle plantarflexion  Ankle inversion    Ankle eversion     (Blank rows = not tested)  LOWER EXTREMITY MMT:  MMT Right eval Left eval  Hip flexion 5 5  Hip extension 3 2+  Hip abduction 3 3  Hip adduction    Hip internal rotation    Hip external rotation    Knee flexion 5 4  Knee extension 5 5  Ankle dorsiflexion 5 5  Ankle plantarflexion    Ankle inversion    Ankle eversion     (Blank rows = not tested)   FUNCTIONAL TESTS:  30 seconds chair stand test:  13; average 12, good is 27 for age and sex  2 minute walk test: 166 ft with trendelenburg gt  Single leg stance: Rt: 19 , Lt:  20                                                                                                                                 TREATMENT DATE:  05/21/23 Nustep seat at 8 level 2 x 5:00 Heel raise x  10 Mini, mini squat x 10 Sitting  LAQ 3# B x 10  Sit to stand x 10 Supine.  SAQ 3# x 10 Bridgex 5  Lt heel slide x 10 B SLR x 10  Practice getting off and on the floor     05/19/23 Evaluation: Sit to stand x 10 Quad set x 10  Heel slide x 5 Bridge x 5    PATIENT EDUCATION:  Education details: Self manual pt has HEP from HH= standing marching, heel raise, toe raise, side steps, sitting LAQ, ankle pumps, sit to stand, hip ab/adduction and walking Person educated: Patient Education method: Explanation, Verbal cues, and Handouts Education comprehension: verbalized understanding and returned demonstration  HOME EXERCISE PROGRAM: pt has HEP from HH= standing marching, heel raise, toe raise, side steps, sitting LAQ, ankle pumps, sit to stand, hip ab/adduction and walking Person educated:  Access Code: [email or web address] URL: [email or web address] Date: 05/19/2023 Prepared by: Virgina Organ  Exercises - Supine Bridge  - 2 x daily - 7 x weekly - 1 sets - 10 reps - 5 hold ASSESSMENT:  CLINICAL IMPRESSION:  Evaluation and goals reviewed with patient. Patient is a 73 y.o. 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  The pt   demonstrates increased edema, increased pain, decreased activity tolerance, decreased balance, decreased ROM and decreased strength.  Ms. Brumbaugh will continue to benefit from skilled PT to address these issues and maximize the pt current functional ability.   OBJECTIVE IMPAIRMENTS: decreased activity tolerance, decreased balance, decreased mobility, difficulty walking, decreased ROM, decreased strength, increased edema, and pain.   ACTIVITY LIMITATIONS: carrying, lifting, bending, sitting, standing, squatting, sleeping, dressing, and locomotion level  PARTICIPATION LIMITATIONS: meal prep, cleaning, laundry, driving, shopping, and community activity   REHAB POTENTIAL:  Good  CLINICAL DECISION MAKING: Stable/uncomplicated  EVALUATION COMPLEXITY: Low   GOALS: Goals reviewed with patient? No  SHORT TERM GOALS: Target date: 2\12/25 Pt to be I in HEP in order to decrease pain to no greater than  Baseline: Goal status: in progress  2.  Pt LT LE mm strength to increase 1/2 grade in order to be able to get into her SUV to drive Baseline:  Goal status:in progress  3.  Pt to increase her Lt knee ROM to 120 to be able to squat down to get onto the floor Baseline:  Goal status: in progress  4.  Pt to be able to single leg stance for  30   seconds in order to reduce her risk of falls  Baseline:  Goal status: in progress    LONG TERM GOALS: Target date: 06/30/23  Pt to be I in an advanced HEP in order to decrease pain to no greater than a 1/10 Baseline:  Goal status: in progress  2.  Pt LT LE mm strength to increase 1 grade in order to be able to return from being on the floor in order to play with her grandchildren with ease  Baseline:  Goal status: in progress  3.  Pt to walk for 10 minutes without difficulty  Goal status: in progress 4.  Pt to be able to go up and down 16 steps in a reciprocal manner to get to her bedroom with ease.  Baseline:  Goal status: in progress   PLAN:  PT FREQUENCY: 2x/week  PT DURATION: 6 weeks  PLANNED INTERVENTIONS: 97110-Therapeutic exercises, 97530- Therapeutic activity, [id number]- Neuromuscular re-education, 97535- Self Care, and [id number]- Manual therapy  PLAN FOR NEXT SESSION: Continue with [facility] TKR rehab  Virgina Organ, PT CLT [phone]  05/21/2023, 2:46 PM

## 2023-05-26 ENCOUNTER — Ambulatory Visit (HOSPITAL_COMMUNITY): Payer: Medicare HMO | Admitting: Physical Therapy

## 2023-05-26 DIAGNOSIS — M25562 Pain in left knee: Secondary | ICD-10-CM

## 2023-05-26 DIAGNOSIS — M6281 Muscle weakness (generalized): Secondary | ICD-10-CM

## 2023-05-26 DIAGNOSIS — R262 Difficulty in walking, not elsewhere classified: Secondary | ICD-10-CM

## 2023-05-26 DIAGNOSIS — M25662 Stiffness of left knee, not elsewhere classified: Secondary | ICD-10-CM

## 2023-05-26 NOTE — Therapy (Signed)
OUTPATIENT PHYSICAL THERAPY LOWER EXTREMITY Treatment  Patient Name: Kaitlyn Williamson MRN: [id number] DOB:Mar 04, 1951, 73 y.o., female Today's Date: 05/26/2023  END OF SESSION:  PT End of Session - 05/26/23 1259     Visit Number 3    Number of Visits 12    Date for PT Re-Evaluation 06/30/23    Authorization Type [employer]    Authorization Time Period 1/24/-3/5    Authorization - Visit Number 3    Authorization - Number of Visits 10    Progress Note Due on Visit 10    PT Start Time 1152    PT Stop Time 1233    PT Time Calculation (min) 41 min    Activity Tolerance Patient tolerated treatment well               Past Medical History:  Diagnosis Date   Arthritis    Gallstones    Helicobacter pylori (H. pylori) infection    Peptic ulcer    Reflux    Renal disorder    Past Surgical History:  Procedure Laterality Date   INSERTION OF MESH  03/23/2014   Procedure: INSERTION OF MESH;  Surgeon: Dalia Heading, MD;  Location: [facility];  Service: General;;   LITHOTRIPSY     VENTRAL HERNIA REPAIR  03/23/2014   Procedure: VENTRAL HERNIORRHAPHY;  Surgeon: Dalia Heading, MD;  Location: [facility];  Service: General;;   Patient Active Problem List   Diagnosis Date Noted   Incarcerated ventral hernia 03/23/2014    PCP: [facility]  REFERRING PROVIDER: Elige Ko., MD  REFERRING DIAG: LEFT KNEE TOTAL 04/02/2023  THERAPY DIAG:  Left knee pain Muscle weakness Left knee stiffness  Rationale for Evaluation and Treatment: Rehabilitation  ONSET DATE: 04/01/24  SUBJECTIVE:   SUBJECTIVE STATEMENT:   Pt states that she has more soreness than anything off.  She was able to get into her SUV and drove today  PERTINENT HISTORY: OA PAIN:  Are you having pain? Yes: NPRS scale: 5; worst 5; best 0 Pain location: left knee back bothers her as well but we will not be addressing this.  Pain description: feels tight  Aggravating factors: activity  Relieving factors: ice,  elevate,  PRECAUTIONS: None    WEIGHT BEARING RESTRICTIONS: No  FALLS:  Has patient fallen in last 6 months? No  LIVING ENVIRONMENT: Lives with: lives with their family Lives in: House/apartment Stairs: Yes: Internal: 14 steps; on right going up Has following equipment at home: Single point cane and Walker - 2 wheeled-does not use   OCCUPATION: retired   PLOF: Independent  PATIENT GOALS: Be able to walk better, less swelling, to have less pain.    NEXT MD VISIT: 05/27/23  OBJECTIVE:  Note: Objective measures were completed at Evaluation unless otherwise noted.  COGNITION: Overall cognitive status: Within functional limits for tasks assessed     SENSATION: WFL  EDEMA:  Noted but normal for surgery   LOWER EXTREMITY ROM:  Active ROM Right eval Left eval  Hip flexion    Hip extension    Hip abduction    Hip adduction    Hip internal rotation    Hip external rotation    Knee flexion 122 112  Knee extension 0 1  Ankle dorsiflexion    Ankle plantarflexion    Ankle inversion    Ankle eversion     (Blank rows = not tested)  LOWER EXTREMITY MMT:  MMT Right eval Left eval  Hip flexion 5 5  Hip extension 3 2+  Hip abduction 3 3  Hip adduction    Hip internal rotation    Hip external rotation    Knee flexion 5 4  Knee extension 5 5  Ankle dorsiflexion 5 5  Ankle plantarflexion    Ankle inversion    Ankle eversion     (Blank rows = not tested)   FUNCTIONAL TESTS:  30 seconds chair stand test:  13; average 12, good is 14 for age and sex  2 minute walk test: 166 ft with trendelenburg gt  Single leg stance: Rt: 19 , Lt:  20                                                                                                                                 TREATMENT DATE:  05/26/23: Heel raise x 10 Terminal extension B x 10  Slant board stretch x one minute Rocker board x 2 minutes Step up  6" step B x 15  Single leg stance x 3 B  Standing Lt knee  flexion 5# x 15 Sit to stand x 15 Knee driver  x 3 LT  Hip extension x 10 B Hip abduction x 10 B  LAQ 5# B x 10  Nustep level 2 x 5:00   05/21/23 Nustep seat at 8 level 2 x 5:00 Heel raise x 10 Mini, mini squat x 10 Sitting  LAQ 3# B x 10  Sit to stand x 10 Supine.  SAQ 3# x 10 Bridgex 5  Lt heel slide x 10 B SLR x 10  Practice getting off and on the floor     05/19/23 Evaluation: Sit to stand x 10 Quad set x 10  Heel slide x 5 Bridge x 5    PATIENT EDUCATION:  Education details: Self manual pt has HEP from HH= standing marching, heel raise, toe raise, side steps, sitting LAQ, ankle pumps, sit to stand, hip ab/adduction and walking Person educated: Patient Education method: Explanation, Verbal cues, and Handouts Education comprehension: verbalized understanding and returned demonstration  HOME EXERCISE PROGRAM: pt has HEP from HH= standing marching, heel raise, toe raise, side steps, sitting LAQ, ankle pumps, sit to stand, hip ab/adduction and walking Person educated:  Access Code: [email or web address] URL: [email or web address] Date: 05/19/2023 Prepared by: Virgina Organ  Exercises - Supine Bridge  - 2 x daily - 7 x weekly - 1 sets - 10 reps - 5 hold ASSESSMENT:  CLINICAL IMPRESSION:  Treatment focused on strengthening of B LE as well as balance to improve pt functional ability.  .  The pt  demonstrates increased edema, increased pain, decreased activity tolerance, decreased balance, decreased ROM and decreased strength.  [name] will continue to benefit from skilled PT to address these issues and maximize the pt current functional ability.   OBJECTIVE IMPAIRMENTS: decreased activity tolerance, decreased balance, decreased mobility, difficulty walking, decreased ROM, decreased strength, increased edema, and pain.   ACTIVITY  LIMITATIONS: carrying, lifting, bending, sitting, standing, squatting, sleeping, dressing, and locomotion level  PARTICIPATION  LIMITATIONS: meal prep, cleaning, laundry, driving, shopping, and community activity   REHAB POTENTIAL: Good  CLINICAL DECISION MAKING: Stable/uncomplicated  EVALUATION COMPLEXITY: Low   GOALS: Goals reviewed with patient? No  SHORT TERM GOALS: Target date: 2\12/25 Pt to be I in HEP in order to decrease pain to no greater than  Baseline: Goal status: in progress  2.  Pt LT LE mm strength to increase 1/2 grade in order to be able to get into her SUV to drive Baseline:  Goal status:in progress  3.  Pt to increase her Lt knee ROM to 120 to be able to squat down to get onto the floor Baseline:  Goal status: in progress  4.  Pt to be able to single leg stance for  30   seconds in order to reduce her risk of falls  Baseline:  Goal status: in progress    LONG TERM GOALS: Target date: 06/30/23  Pt to be I in an advanced HEP in order to decrease pain to no greater than a 1/10 Baseline:  Goal status: in progress  2.  Pt LT LE mm strength to increase 1 grade in order to be able to return from being on the floor in order to play with her grandchildren with ease  Baseline:  Goal status: in progress  3.  Pt to walk for 10 minutes without difficulty  Goal status: in progress 4.  Pt to be able to go up and down 16 steps in a reciprocal manner to get to her bedroom with ease.  Baseline:  Goal status: in progress   PLAN:  PT FREQUENCY: 2x/week  PT DURATION: 6 weeks  PLANNED INTERVENTIONS: 97110-Therapeutic exercises, 97530- Therapeutic activity, [id number]- Neuromuscular re-education, 97535- Self Care, and 40981- Manual therapy  PLAN FOR NEXT SESSION: Continue with [facility] TKR rehab  Virgina Organ, PT CLT [phone]  05/26/2023, 1:00 PM

## 2023-06-02 ENCOUNTER — Encounter (HOSPITAL_COMMUNITY): Payer: Medicare HMO | Admitting: Physical Therapy

## 2023-06-02 ENCOUNTER — Ambulatory Visit (HOSPITAL_COMMUNITY): Payer: Medicare HMO | Attending: Sports Medicine | Admitting: Physical Therapy

## 2023-06-02 DIAGNOSIS — M25562 Pain in left knee: Secondary | ICD-10-CM | POA: Diagnosis present

## 2023-06-02 DIAGNOSIS — R262 Difficulty in walking, not elsewhere classified: Secondary | ICD-10-CM | POA: Diagnosis present

## 2023-06-02 DIAGNOSIS — M25662 Stiffness of left knee, not elsewhere classified: Secondary | ICD-10-CM | POA: Diagnosis present

## 2023-06-02 DIAGNOSIS — M6281 Muscle weakness (generalized): Secondary | ICD-10-CM | POA: Diagnosis present

## 2023-06-02 NOTE — Therapy (Signed)
 OUTPATIENT PHYSICAL THERAPY LOWER EXTREMITY Treatment  Patient Name: Kaitlyn Williamson MRN: [id number] DOB:April 20, 1951, 73 y.o., female Today's Date: 06/02/2023  END OF SESSION:  PT End of Session - 06/02/23 1342     Visit Number 4    Number of Visits 12    Date for PT Re-Evaluation 06/30/23    Authorization Type [employer]    Authorization Time Period 1/24/-3/5    Authorization - Visit Number 4    Authorization - Number of Visits 10    Progress Note Due on Visit 10    PT Start Time 1300    PT Stop Time 1343    PT Time Calculation (min) 43 min    Activity Tolerance Patient tolerated treatment well                Past Medical History:  Diagnosis Date   Arthritis    Gallstones    Helicobacter pylori (H. pylori) infection    Peptic ulcer    Reflux    Renal disorder    Past Surgical History:  Procedure Laterality Date   INSERTION OF MESH  03/23/2014   Procedure: INSERTION OF MESH;  Surgeon: Oneil DELENA Budge, MD;  Location: [facility];  Service: General;;   LITHOTRIPSY     VENTRAL HERNIA REPAIR  03/23/2014   Procedure: VENTRAL HERNIORRHAPHY;  Surgeon: Oneil DELENA Budge, MD;  Location: [facility];  Service: General;;   Patient Active Problem List   Diagnosis Date Noted   Incarcerated ventral hernia 03/23/2014    PCP: [facility]  REFERRING PROVIDER: Ada Lavelle BROCKS., MD  REFERRING DIAG: LEFT KNEE TOTAL 04/02/2023  THERAPY DIAG:  Left knee pain Muscle weakness Left knee stiffness  Rationale for Evaluation and Treatment: Rehabilitation  ONSET DATE: 04/01/24  SUBJECTIVE:   SUBJECTIVE STATEMENT:   Pt states that she has more soreness than anything  main problem is her right knee and hip PERTINENT HISTORY: OA PAIN:  Are you having pain? Yes: NPRS scale: 3; worst 5; best 0 Pain location: left knee back bothers her as well but we will not be addressing this.  Pain description: feels tight  Aggravating factors: activity  Relieving factors: ice, elevate,  PRECAUTIONS:  None    WEIGHT BEARING RESTRICTIONS: No  FALLS:  Has patient fallen in last 6 months? No  LIVING ENVIRONMENT: Lives with: lives with their family Lives in: House/apartment Stairs: Yes: Internal: 14 steps; on right going up Has following equipment at home: Single point cane and Walker - 2 wheeled-does not use   OCCUPATION: retired   PLOF: Independent  PATIENT GOALS: Be able to walk better, less swelling, to have less pain.    NEXT MD VISIT: 05/27/23  OBJECTIVE:  Note: Objective measures were completed at Evaluation unless otherwise noted.  COGNITION: Overall cognitive status: Within functional limits for tasks assessed     SENSATION: WFL  EDEMA:  Noted but normal for surgery   LOWER EXTREMITY ROM:  Active ROM Right eval Left eval  Hip flexion    Hip extension    Hip abduction    Hip adduction    Hip internal rotation    Hip external rotation    Knee flexion 122 112  Knee extension 0 1  Ankle dorsiflexion    Ankle plantarflexion    Ankle inversion    Ankle eversion     (Blank rows = not tested)  LOWER EXTREMITY MMT:  MMT Right eval Left eval  Hip flexion 5 5  Hip extension 3  2+  Hip abduction 3 3  Hip adduction    Hip internal rotation    Hip external rotation    Knee flexion 5 4  Knee extension 5 5  Ankle dorsiflexion 5 5  Ankle plantarflexion    Ankle inversion    Ankle eversion     (Blank rows = not tested)   FUNCTIONAL TESTS:  30 seconds chair stand test:  13; average 12, good is 54 for age and sex  2 minute walk test: 166 ft with trendelenburg gt  Single leg stance: Rt: 19 , Lt:  20                                                                                                                                 TREATMENT DATE:  06/02/23 Heel raise x 10 Terminal extension B x 15  Slant board stretch x one minute Rocker board x 2 minutes Step up  6 step LT x 15; Rt 5 reps  Single leg stance x 5 B  Standing B knee flexion 5# x  10 Sit to stand x 15 Hamstring stretch on 12 box x 20 x 3 Knee driver  x 5 Hip extension x 10 B with red thera-band Hip abduction x 10 B with red thera band  LAQ 5# B x 15   05/26/23: Heel raise x 10 Terminal extension B x 10  Slant board stretch x one minute Rocker board x 2 minutes Step up  6 step B x 15  Single leg stance x 3 B  Standing Lt knee flexion 5# x 15 Sit to stand x 15 Knee driver  x 3 LT  Hip extension x 10 B Hip abduction x 10 B  LAQ 5# B x 10  Nustep level 2 x 5:00   PATIENT EDUCATION:  Education details: Self manual pt has HEP from HH= standing marching, heel raise, toe raise, side steps, sitting LAQ, ankle pumps, sit to stand, hip ab/adduction and walking Person educated: Patient Education method: Explanation, Verbal cues, and Handouts Education comprehension: verbalized understanding and returned demonstration  HOME EXERCISE PROGRAM: pt has HEP from HH= standing marching, heel raise, toe raise, side steps, sitting LAQ, ankle pumps, sit to stand, hip ab/adduction and walking Person educated:  Access Code: [email or web address] URL: [email or web address] Date: 05/19/2023 Prepared by: Montie Metro  Exercises - Supine Bridge  - 2 x daily - 7 x weekly - 1 sets - 10 reps - 5 hold ASSESSMENT:  CLINICAL IMPRESSION:  Treatment continues to focus on strengthening of B LE as well as balance to improve pt functional ability. Pt needs multiple rest breaks secondary to Rt not Lt knee pain.    The pt  demonstrates increased edema, increased pain, decreased activity tolerance, decreased balance, decreased ROM and decreased strength.  [name] will continue to benefit from skilled PT to address these issues and maximize the pt current functional ability.   OBJECTIVE IMPAIRMENTS:  decreased activity tolerance, decreased balance, decreased mobility, difficulty walking, decreased ROM, decreased strength, increased edema, and pain.   ACTIVITY LIMITATIONS: carrying,  lifting, bending, sitting, standing, squatting, sleeping, dressing, and locomotion level  PARTICIPATION LIMITATIONS: meal prep, cleaning, laundry, driving, shopping, and community activity   REHAB POTENTIAL: Good  CLINICAL DECISION MAKING: Stable/uncomplicated  EVALUATION COMPLEXITY: Low   GOALS: Goals reviewed with patient? No  SHORT TERM GOALS: Target date: 2\12/25 Pt to be I in HEP in order to decrease pain to no greater than  Baseline: Goal status: in progress  2.  Pt LT LE mm strength to increase 1/2 grade in order to be able to get into her SUV to drive Baseline:  Goal status:in progress  3.  Pt to increase her Lt knee ROM to 120 to be able to squat down to get onto the floor Baseline:  Goal status: in progress  4.  Pt to be able to single leg stance for  30   seconds in order to reduce her risk of falls  Baseline:  Goal status: in progress    LONG TERM GOALS: Target date: 06/30/23  Pt to be I in an advanced HEP in order to decrease pain to no greater than a 1/10 Baseline:  Goal status: in progress  2.  Pt LT LE mm strength to increase 1 grade in order to be able to return from being on the floor in order to play with her grandchildren with ease  Baseline:  Goal status: in progress  3.  Pt to walk for 10 minutes without difficulty  Goal status: in progress 4.  Pt to be able to go up and down 16 steps in a reciprocal manner to get to her bedroom with ease.  Baseline:  Goal status: in progress   PLAN:  PT FREQUENCY: 2x/week  PT DURATION: 6 weeks  PLANNED INTERVENTIONS: 97110-Therapeutic exercises, 97530- Therapeutic activity, [id number]- Neuromuscular re-education, 97535- Self Care, and 02859- Manual therapy  PLAN FOR NEXT SESSION: Continue with [facility] TKR rehab  Montie Metro, PT CLT [phone]  06/02/2023, 1:43 PM

## 2023-06-04 ENCOUNTER — Ambulatory Visit (HOSPITAL_COMMUNITY): Payer: Medicare HMO | Admitting: Physical Therapy

## 2023-06-04 DIAGNOSIS — M25562 Pain in left knee: Secondary | ICD-10-CM

## 2023-06-04 DIAGNOSIS — M25662 Stiffness of left knee, not elsewhere classified: Secondary | ICD-10-CM

## 2023-06-04 DIAGNOSIS — M6281 Muscle weakness (generalized): Secondary | ICD-10-CM

## 2023-06-04 DIAGNOSIS — R262 Difficulty in walking, not elsewhere classified: Secondary | ICD-10-CM

## 2023-06-04 NOTE — Therapy (Addendum)
 OUTPATIENT PHYSICAL THERAPY LOWER EXTREMITY Treatment  Patient Name: Kaitlyn Williamson MRN: [id number] DOB:1951-03-11, 73 y.o., female Today's Date: 06/04/2023  END OF SESSION:  PT End of Session - 06/04/23 0934     Visit Number 5    Number of Visits 12    Date for PT Re-Evaluation 06/30/23    Authorization Type [employer]    Authorization Time Period 1/24/-3/5    Authorization - Visit Number 5    Authorization - Number of Visits 10    Progress Note Due on Visit 10    PT Start Time 0855    PT Stop Time 0935    PT Time Calculation (min) 40 min    Activity Tolerance Patient tolerated treatment well                 Past Medical History:  Diagnosis Date   Arthritis    Gallstones    Helicobacter pylori (H. 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   PERTINENT HISTORY: OA PAIN:  Are you having pain? Yes: NPRS scale: 2; worst 5; best 0 Pain location: left knee back bothers her as well but we will not be addressing this.  Pain description: feels tight  Aggravating factors: activity  Relieving factors: ice, elevate, Pt non operated knee is at a  6/10  PRECAUTIONS: None    WEIGHT BEARING RESTRICTIONS: No  FALLS:  Has patient fallen in last 6 months? No  LIVING ENVIRONMENT: Lives with: lives with their family Lives in: House/apartment Stairs: Yes: Internal: 14 steps; on right going up Has following equipment at home: Single point cane and Walker - 2 wheeled-does not use   OCCUPATION: retired   PLOF: Independent  PATIENT GOALS: Be able to walk better, less swelling, to have less pain.    NEXT MD VISIT: 05/27/23  OBJECTIVE:  Note: Objective measures were completed at Evaluation unless otherwise noted.  COGNITION: Overall cognitive status: Within functional limits for tasks assessed     SENSATION: WFL  EDEMA:  Noted but normal for surgery   LOWER EXTREMITY ROM:  Active ROM Right eval Left eval 2/7/  Hip flexion     Hip extension     Hip abduction     Hip adduction     Hip internal rotation     Hip external rotation     Knee flexion 122 112 125  Knee extension 0 1 0  Ankle dorsiflexion     Ankle plantarflexion     Ankle inversion     Ankle eversion      (Blank rows =  not tested)  LOWER EXTREMITY MMT:  MMT Right eval Left eval  Hip flexion 5 5  Hip extension 3 2+  Hip abduction 3 3  Hip adduction    Hip internal rotation    Hip external rotation    Knee flexion 5 4  Knee extension 5 5  Ankle dorsiflexion 5 5  Ankle plantarflexion    Ankle inversion    Ankle eversion     (Blank rows = not tested)   FUNCTIONAL TESTS:  30 seconds chair stand test:  13; average 12, good is 14 for age and sex  2 minute walk test: 166 ft with trendelenburg gt  Single leg stance: Rt: 19 , Lt:  20                                                                                                                                 TREATMENT DATE:  06/04/23 ROM: 0-120 Wall arch x 10 Attempted standing mini  squats but limited by RT knee pain only able to complete 5 Sitting LAQ 5# x 15 B Terminal extension B x 15   Tandem stance for balance with both RT and Lt in front x 5 each Heel raise x 15  Slant board stretch  Lunging onto 4 box x 10 Lt    06/02/23 Heel raise x 10 Terminal extension B x 15  Slant board stretch x one minute Rocker board x 2 minutes Step up  6 step LT x 15; Rt 5 reps  Single leg stance x 5 B  Standing B knee flexion 5# x 10 Sit to stand x 15 Hamstring stretch on 12 box x 20 x 3 Knee driver  x 5 Hip extension x 10 B with red thera-band Hip abduction x 10 B with red thera band  LAQ 5# B x 15   05/26/23: Heel raise x 10 Terminal extension B x 10  Slant board stretch x one minute Rocker board x 2 minutes Step up  6 step B x 15  Single leg stance x 3 B  Standing Lt knee flexion 5# x 15 Sit to stand x 15 Knee driver  x 3 LT  Hip extension x 10 B Hip abduction x 10 B  LAQ 5# B x 10  Nustep level 2 x 5:00   PATIENT EDUCATION:  Education details: Self manual pt has HEP from HH= standing marching, heel raise, toe raise, side steps, sitting LAQ, ankle pumps, sit to stand, hip ab/adduction and walking Person educated: Patient Education method: Explanation, Verbal cues, and Handouts Education comprehension: verbalized understanding and returned demonstration  HOME EXERCISE PROGRAM: pt has HEP from HH= standing marching, heel raise, toe raise, side steps, sitting LAQ, ankle pumps, sit to stand, hip ab/adduction and walking Person educated:  Access Code: [email or web address] URL: [email or web address] Date: 05/19/2023 Prepared by: Montie Metro  Exercises - Supine Bridge  - 2 x daily - 7 x weekly -  1 sets - 10 reps - 5 hold ASSESSMENT:  CLINICAL IMPRESSION: Pt ROM wfl  Pt continues to needs multiple rest breaks secondary to Rt not Lt knee pain.    Therapist avoided single leg wt activity due to Rt knee pain.  The pt  demonstrates increased edema, increased pain, decreased activity tolerance, decreased balance, decreased ROM and decreased strength.  Ms. Alwine  will continue to benefit from skilled PT to address these issues and maximize the pt current functional ability.   OBJECTIVE IMPAIRMENTS: decreased activity tolerance, decreased balance, decreased mobility, difficulty walking, decreased ROM, decreased strength, increased edema, and pain.   ACTIVITY LIMITATIONS: carrying, lifting, bending, sitting, standing, squatting, sleeping, dressing, and locomotion level  PARTICIPATION LIMITATIONS: meal prep, cleaning, laundry, driving, shopping, and community activity   REHAB POTENTIAL: Good  CLINICAL DECISION MAKING: Stable/uncomplicated  EVALUATION COMPLEXITY: Low   GOALS: Goals reviewed with patient? No  SHORT TERM GOALS: Target date: 2\12/25 Pt to be I in HEP in order to decrease pain to no greater than a 3/10 Baseline: Goal status: in progress  2.  Pt LT LE mm strength to increase 1/2 grade in order to be able to get into her SUV to drive Baseline:  Goal status:in progress  3.  Pt to increase her Lt knee ROM to 120 to be able to squat down to get onto the floor Baseline:  Goal status: in progress  4.  Pt to be able to single leg stance for  30   seconds in order to reduce her risk of falls  Baseline:  Goal status: in progress    LONG TERM GOALS: Target date: 06/30/23  Pt to be I in an advanced HEP in order to decrease pain to no greater than a 1/10 Baseline:  Goal status: in progress  2.  Pt LT LE mm strength to increase 1 grade in order to be able to return from being on the floor in order to play with her grandchildren with ease  Baseline:  Goal status: in progress  3.  Pt to walk for 10 minutes without difficulty  Goal status: in progress 4.  Pt to be able to go up and down 16 steps in a reciprocal manner to get to her bedroom with ease.  Baseline:  Goal status: in progress   PLAN:  PT FREQUENCY: 2x/week  PT DURATION: 6 weeks  PLANNED INTERVENTIONS: 97110-Therapeutic exercises, 97530- Therapeutic activity,  [id number]- Neuromuscular re-education, 97535- Self Care, and 02859- Manual therapy  PLAN FOR NEXT SESSION: Continue with [facility] TKR rehab  Montie Metro, PT CLT [phone]  06/04/2023, 9:35 AM

## 2023-06-09 ENCOUNTER — Encounter (HOSPITAL_COMMUNITY): Payer: Medicare HMO | Admitting: Physical Therapy

## 2023-06-11 ENCOUNTER — Ambulatory Visit (HOSPITAL_COMMUNITY): Payer: Medicare HMO | Admitting: Physical Therapy

## 2023-06-11 DIAGNOSIS — M25562 Pain in left knee: Secondary | ICD-10-CM | POA: Diagnosis not present

## 2023-06-11 DIAGNOSIS — M25662 Stiffness of left knee, not elsewhere classified: Secondary | ICD-10-CM

## 2023-06-11 DIAGNOSIS — M6281 Muscle weakness (generalized): Secondary | ICD-10-CM

## 2023-06-11 NOTE — Therapy (Signed)
OUTPATIENT PHYSICAL THERAPY LOWER EXTREMITY Treatment  Patient Name: Kaitlyn Williamson MRN: [id number] DOB:[birth year], 73 y.o., female Today's Date: 06/11/2023  END OF SESSION:  PT End of Session - 06/11/23 0927     Visit Number 6    Number of Visits 12    Date for PT Re-Evaluation 06/30/23    Authorization Type [employer]    Authorization Time Period 1/24/-3/5    Authorization - Visit Number 6    Authorization - Number of Visits 10    Progress Note Due on Visit 10    PT Start Time 0850    PT Stop Time 0930    PT Time Calculation (min) 40 min    Activity Tolerance Patient tolerated treatment well    Behavior During Therapy [facility] for tasks assessed/performed                  Past Medical History:  Diagnosis Date   Arthritis    Gallstones    Helicobacter pylori (H. 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   PERTINENT HISTORY: OA PAIN:  Are you having pain? Yes: NPRS scale: 2; worst 5; best 0; PT RT knee non operated is a 6/10  Pain location: left knee back bothers her as well but we will not be addressing this.  Pain description: feels  tight  Aggravating factors: activity  Relieving factors: ice, elevate, Pt non operated knee is at a 6/10  PRECAUTIONS: None    WEIGHT BEARING RESTRICTIONS: No  FALLS:  Has patient fallen in last 6 months? No  LIVING ENVIRONMENT: Lives with: lives with their family Lives in: House/apartment Stairs: Yes: Internal: 14 steps; on right going up Has following equipment at home: Single point cane and Walker - 2 wheeled-does not use   OCCUPATION: retired   PLOF: Independent  PATIENT GOALS: Be able to walk better, less swelling, to have less pain.    NEXT MD VISIT: 05/27/23  OBJECTIVE:  Note: Objective measures were completed at Evaluation unless otherwise noted.  COGNITION: Overall cognitive status: Within functional limits for tasks assessed     SENSATION: WFL  EDEMA:  Noted but normal for surgery   LOWER EXTREMITY ROM:  Active ROM Right eval Left eval 06/04/23  Hip flexion     Hip extension     Hip abduction     Hip adduction     Hip internal rotation     Hip external rotation     Knee flexion 122 112 125  Knee extension 0 1 0  Ankle dorsiflexion     Ankle plantarflexion  Ankle inversion     Ankle eversion      (Blank rows = not tested)  LOWER EXTREMITY MMT:  MMT Right eval Left eval  Hip flexion 5 5  Hip extension 3 2+  Hip abduction 3 3  Hip adduction    Hip internal rotation    Hip external rotation    Knee flexion 5 4  Knee extension 5 5  Ankle dorsiflexion 5 5  Ankle plantarflexion    Ankle inversion    Ankle eversion     (Blank rows = not tested)   FUNCTIONAL TESTS:  30 seconds chair stand test:  13; average 12, good is 20 for age and sex  2 minute walk test: 166 ft with trendelenburg gt  Single leg stance: Rt: 19 , Lt:  20                                                                                                                                 TREATMENT DATE:  06/11/2023 Rocker board x 2 minutes  Knee flexion x 10 3#  B Slant board x 30 " x 3 LAQ 4# x 15 B  Heel raise x 15 Tandem stance B x 5 reps  Lunge onto 4" step x 10 B Mini squat x 10 Terminal knee extension x 10 B Nustep level 3 x 5:00  06/04/23 ROM: 0-120 Wall arch x 10 Attempted standing mini  squats but limited by RT knee pain only able to complete 5 Sitting LAQ 5# x 15 B Terminal extension B x 15  Tandem stance for balance with both RT and Lt in front x 5 each Heel raise x 15  Slant board stretch  Lunging onto 4" box x 10 Lt    06/02/23 Heel raise x 10 Terminal extension B x 15  Slant board stretch x one minute Rocker board x 2 minutes Step up  6" step LT x 15; Rt 5 reps  Single leg stance x 5 B  Standing B knee flexion 5# x 10 Sit to stand x 15 Hamstring stretch on 12" box x 20" x 3 Knee driver  x 5 Hip extension x 10 B with red thera-band Hip abduction x 10 B with red thera band  LAQ 5# B x 15   05/26/23: Heel raise x 10 Terminal extension B x 10  Slant board stretch x one minute Rocker board x 2 minutes Step up  6" step B x 15  Single leg stance x 3 B  Standing Lt knee flexion 5# x 15 Sit to stand x 15 Knee driver  x 3 LT  Hip extension x 10 B Hip abduction x 10 B  LAQ 5# B x 10  Nustep level 2 x 5:00   PATIENT EDUCATION:  Education details: Self manual pt has HEP from HH= standing marching, heel raise, toe raise, side steps, sitting LAQ, ankle pumps, sit to stand, hip ab/adduction  and walking Person educated: Patient Education method: Explanation, Verbal cues, and Handouts Education comprehension: verbalized understanding and returned demonstration  HOME EXERCISE PROGRAM: pt has HEP from HH= standing marching, heel raise, toe raise, side steps, sitting LAQ, ankle pumps, sit to stand, hip ab/adduction and walking Person educated:  Access Code: [email or web address] URL: [email or web address] Date: 05/19/2023 Prepared by: Virgina Organ  Exercises - Supine Bridge  - 2 x daily - 7 x weekly - 1 sets - 10  reps - 5 hold ASSESSMENT:  CLINICAL IMPRESSION: Pt continues to needs multiple rest breaks secondary to Rt not Lt knee pain.  Pt able to tolerate mini squat and lunges today.    Therapist avoided single leg wt activity due to Rt knee pain.  The pt  demonstrates increased edema, increased pain, decreased activity tolerance, decreased balance, decreased ROM and decreased strength.  Ms. Freels will continue to benefit from skilled PT to address these issues and maximize the pt current functional ability.   OBJECTIVE IMPAIRMENTS: decreased activity tolerance, decreased balance, decreased mobility, difficulty walking, decreased ROM, decreased strength, increased edema, and pain.   ACTIVITY LIMITATIONS: carrying, lifting, bending, sitting, standing, squatting, sleeping, dressing, and locomotion level  PARTICIPATION LIMITATIONS: meal prep, cleaning, laundry, driving, shopping, and community activity   REHAB POTENTIAL: Good  CLINICAL DECISION MAKING: Stable/uncomplicated  EVALUATION COMPLEXITY:  Low   GOALS: Goals reviewed with patient? No  SHORT TERM GOALS: Target date: 2\12/25 Pt to be I in HEP in order to decrease pain to no greater than a 3/10 Baseline: Goal status: in progress  2.  Pt LT LE mm strength to increase 1/2 grade in order to be able to get into her SUV to drive Baseline:  Goal status:in progress  3.  Pt to increase her Lt knee ROM to 120 to be able to squat down to get onto the floor Baseline:  Goal status: in progress  4.  Pt to be able to single leg stance for  30   seconds in order to reduce her risk of falls  Baseline:  Goal status: in progress    LONG TERM GOALS: Target date: 06/30/23  Pt to be I in an advanced HEP in order to decrease pain to no greater than a 1/10 Baseline:  Goal status: in progress  2.  Pt LT LE mm strength to increase 1 grade in order to be able to return from being on the floor in order to play with her grandchildren with ease   Baseline:  Goal status: in progress  3.  Pt to walk for 10 minutes without difficulty  Goal status: in progress 4.  Pt to be able to go up and down 16 steps in a reciprocal manner to get to her bedroom with ease.  Baseline:  Goal status: in progress   PLAN:  PT FREQUENCY: 2x/week  PT DURATION: 6 weeks  PLANNED INTERVENTIONS: 97110-Therapeutic exercises, 97530- Therapeutic activity, [id number]- Neuromuscular re-education, 97535- Self Care, and 16109- Manual therapy  PLAN FOR NEXT SESSION: Continue with [facility] TKR rehab  Virgina Organ, PT CLT [phone]  06/11/2023, 9:28 AM

## 2023-06-16 ENCOUNTER — Encounter (HOSPITAL_COMMUNITY): Payer: Medicare HMO | Admitting: Physical Therapy

## 2023-06-18 ENCOUNTER — Encounter (HOSPITAL_COMMUNITY): Payer: Medicare HMO | Admitting: Physical Therapy

## 2023-06-23 ENCOUNTER — Encounter (HOSPITAL_COMMUNITY): Payer: Medicare HMO | Admitting: Physical Therapy

## 2023-06-24 ENCOUNTER — Encounter (HOSPITAL_COMMUNITY): Payer: Medicare HMO

## 2023-06-25 ENCOUNTER — Ambulatory Visit (HOSPITAL_COMMUNITY): Payer: Medicare HMO | Admitting: Physical Therapy

## 2023-06-25 DIAGNOSIS — M6281 Muscle weakness (generalized): Secondary | ICD-10-CM

## 2023-06-25 DIAGNOSIS — M25562 Pain in left knee: Secondary | ICD-10-CM | POA: Diagnosis not present

## 2023-06-25 DIAGNOSIS — M25662 Stiffness of left knee, not elsewhere classified: Secondary | ICD-10-CM

## 2023-06-25 DIAGNOSIS — R262 Difficulty in walking, not elsewhere classified: Secondary | ICD-10-CM

## 2023-06-25 NOTE — Therapy (Addendum)
 OUTPATIENT PHYSICAL THERAPY LOWER EXTREMITY Treatment  Patient Name: Kaitlyn Williamson MRN: [id number] DOB:March 24, 1951, 73 y.o., female Today's Date: 06/29/2023  END OF SESSION:   PT End of Session - 06/11/23 0927       Visit Number 7    Number of Visits 12     Date for PT Re-Evaluation 06/30/23     Authorization Type [employer]     Authorization Time Period 1/24/-3/5     Authorization - Visit Number 7    Authorization - Number of Visits 10     Progress Note Due on Visit 10     PT Start Time 1020    PT Stop Time 1100    PT Time Calculation (min) 40 min     Activity Tolerance Patient tolerated treatment well     Behavior During Therapy WFL for tasks assessed/performed                 Past Medical History:  Diagnosis Date   Arthritis    Gallstones    Helicobacter pylori (H. 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   PERTINENT HISTORY: OA PAIN:  Are you having pain? Yes: NPRS scale: 2; worst 5; best 0; PT RT knee non operated is a 6/10  Pain location: left knee back bothers her as well but we will not be addressing this.  Pain description:  feels tight  Aggravating factors: activity  Relieving factors: ice, elevate, Pt non operated knee is at a 6/10  PRECAUTIONS: None    WEIGHT BEARING RESTRICTIONS: No  FALLS:  Has patient fallen in last 6 months? No  LIVING ENVIRONMENT: Lives with: lives with their family Lives in: House/apartment Stairs: Yes: Internal: 14 steps; on right going up Has following equipment at home: Single point cane and Walker - 2 wheeled-does not use   OCCUPATION: retired   PLOF: Independent  PATIENT GOALS: Be able to walk better, less swelling, to have less pain.    NEXT MD VISIT: 05/27/23  OBJECTIVE:  Note: Objective measures were completed at Evaluation unless otherwise noted.  COGNITION: Overall cognitive status: Within functional limits for tasks assessed     SENSATION: WFL  EDEMA:  Noted but normal for surgery   LOWER EXTREMITY ROM:  Active ROM Right eval Left eval 06/04/23  Hip flexion     Hip extension     Hip abduction     Hip adduction     Hip internal rotation     Hip external rotation     Knee flexion 122 112 125  Knee extension 0 1  0  Ankle dorsiflexion     Ankle plantarflexion     Ankle inversion     Ankle eversion      (Blank rows = not tested)  LOWER EXTREMITY MMT:  MMT Right eval Left eval  Hip flexion 5 5  Hip extension 3 2+  Hip abduction 3 3  Hip adduction    Hip internal rotation    Hip external rotation    Knee flexion 5 4  Knee extension 5 5  Ankle dorsiflexion 5 5  Ankle plantarflexion    Ankle inversion    Ankle eversion     (Blank rows = not tested)   FUNCTIONAL TESTS:  30 seconds chair stand test:  13; average 12, good is 14 for age and sex  2 minute walk test: 166 ft with trendelenburg gt  Single leg stance: Rt: 19 , Lt:  20                                                                                                                                 TREATMENT DATE:  06/25/23 Nustep Level 3 x 7 minutes Single leg stance:  Lt  15 seconds  Heel raise: 15 Mini squat x 5- increased crepitus and pain with RT LE stopped \ LAQ with green theraband x 15 Tandem stance x 5 with head turns B  Side stepping with theraband  x 2 RT  Hip extension with green theraband x 5 each leg repeat 3 x ( limit 5 due to RT knee pain) Lt lunge  x 15 Steps reciprocal x 4 steps 06/11/2023 Rocker board x 2 minutes  Knee flexion x 10 3# B Slant board x 30 " x 3 LAQ 4# x 15 B  Heel raise x 15 Tandem stance B x 5 reps  Lunge onto 4" step x 10 B Mini squat x 10 Terminal knee extension x 10 B Nustep level 3 x 5:00  06/04/23 ROM: 0-120 Wall arch x 10 Attempted standing mini  squats but limited by RT knee pain only able to complete 5 Sitting LAQ 5# x 15 B Terminal extension B x 15  Tandem stance for balance with both RT and Lt in front x 5 each Heel raise x 15  Slant board stretch  Lunging onto 4" box x 10 Lt    06/02/23 Heel raise x 10 Terminal extension B x 15  Slant board stretch x one minute Rocker board x 2 minutes Step up  6" step LT x 15; Rt 5 reps  Single leg stance x 5 B  Standing B knee flexion 5# x 10 Sit to stand x 15 Hamstring stretch on 12" box x 20" x 3 Knee driver  x 5 Hip extension x 10 B with red thera-band Hip abduction x 10 B with red thera band  LAQ 5# B x 15    PATIENT EDUCATION:  Education details: Self manual pt has HEP  from HH= standing marching, heel raise, toe raise, side steps, sitting LAQ, ankle pumps, sit to stand, hip ab/adduction and walking Person educated: Patient Education method: Explanation, Verbal cues, and Handouts Education comprehension: verbalized understanding and returned demonstration  HOME EXERCISE PROGRAM: pt has HEP from HH= standing marching, heel raise, toe raise, side steps, sitting LAQ, ankle pumps, sit to stand, hip ab/adduction and walking Person educated:  Access Code: [email or web address] URL: [email or web address] Date: 05/19/2023 Prepared by: Virgina Organ  Exercises - Supine Bridge  - 2 x daily - 7 x weekly - 1 sets - 10 reps - 5 hold 06/25/23 - Side Stepping with Resistance at Thighs  - 1 x daily - 7 x weekly - 1 sets - 10 reps - Standing 3-Way Leg Reach with Resistance at Ankles and Counter Support  - 1 x daily - 7 x weekly - 3 sets - 5 reps - 3-5 seconds  hold ASSESSMENT:  CLINICAL IMPRESSION: Pt continues to needs multiple rest breaks secondary to Rt not Lt knee pain.  Pt able to tolerate mini squat and lunges today.    Therapist continues to  avoided single leg wt activity due to Rt knee pain.  Added resisted band exercises for B hips and given as a HEP.  The pt  demonstrates increased edema, increased pain, decreased activity tolerance, decreased balance, decreased ROM and decreased strength.  Ms. Lebon will continue to benefit from skilled PT to address these issues and maximize the pt current functional ability.   OBJECTIVE IMPAIRMENTS: decreased activity tolerance, decreased balance, decreased mobility, difficulty walking, decreased ROM, decreased strength, increased edema, and pain.   ACTIVITY LIMITATIONS: carrying, lifting, bending, sitting, standing, squatting, sleeping, dressing, and locomotion level  PARTICIPATION LIMITATIONS: meal prep, cleaning, laundry, driving, shopping, and community activity   REHAB POTENTIAL: Good  CLINICAL DECISION MAKING: Stable/uncomplicated  EVALUATION COMPLEXITY:  Low   GOALS: Goals reviewed with patient? No  SHORT TERM GOALS: Target date: 2\12/25 Pt to be I in HEP in order to decrease pain to no greater than a 3/10 Baseline: Goal status: met  2.  Pt LT LE mm strength to increase 1/2 grade in order to be able to get into her SUV to drive Baseline:  Goal status:met  3.  Pt to increase her Lt knee ROM to 120 to be able to squat down to get onto the floor Baseline:  Goal status: partial met ; Lt ROM is 125 but unable to squat due to RT LE   4.  Pt to be able to single leg stance  for  30   seconds in order to reduce her risk of falls  Baseline:  Goal status: in progress    LONG TERM GOALS: Target date: 06/30/23  Pt to be I in an advanced HEP in order to decrease pain to no greater than a 1/10 Baseline:  Goal status: in progress  2.  Pt LT LE mm strength to increase 1 grade in order to be able to return from being on the floor in order to play with her grandchildren with ease  Baseline:  Goal status:met   3.  Pt to walk for 10 minutes without difficulty  Goal status: not met due to back  4.  Pt to be able to go up and down 16 steps in a reciprocal manner to get to her bedroom with ease.  Baseline:  Goal status: partially met pt continues to go one step at a time but more out of habit.  PLAN:  PT FREQUENCY: 2x/week  PT DURATION: 6 weeks  PLANNED INTERVENTIONS: 97110-Therapeutic exercises, 97530- Therapeutic activity, [id number]- Neuromuscular re-education, 97535- Self Care, and 16109- Manual therapy  PLAN FOR NEXT SESSION: reassess  Virgina Organ, PT CLT [phone]  06/29/2023, 1:34 PM

## 2023-06-29 ENCOUNTER — Ambulatory Visit (HOSPITAL_COMMUNITY): Payer: Medicare HMO | Attending: Sports Medicine | Admitting: Physical Therapy

## 2023-06-29 DIAGNOSIS — R262 Difficulty in walking, not elsewhere classified: Secondary | ICD-10-CM | POA: Diagnosis present

## 2023-06-29 DIAGNOSIS — M25562 Pain in left knee: Secondary | ICD-10-CM | POA: Insufficient documentation

## 2023-06-29 DIAGNOSIS — M25662 Stiffness of left knee, not elsewhere classified: Secondary | ICD-10-CM | POA: Diagnosis present

## 2023-06-29 DIAGNOSIS — M6281 Muscle weakness (generalized): Secondary | ICD-10-CM | POA: Insufficient documentation

## 2023-06-29 NOTE — Therapy (Signed)
 OUTPATIENT PHYSICAL THERAPY LOWER EXTREMITY Treatment/Discharge  Patient Name: Kaitlyn Williamson MRN: [id number] DOB:1950-12-11, 73 y.o., female Today's Date: 06/29/2023 PHYSICAL THERAPY DISCHARGE SUMMARY  Visits from Start of Care: 8  Current functional level related to goals / functional outcomes: Goals have been met   Remaining deficits: Gluteal weakness B    Education / Equipment: Importance of continuing her HEP    Patient agrees to discharge. Patient goals were met. Patient is being discharged due to being pleased with the current functional level.  END OF SESSION:  PT End of Session - 06/29/23 1627     Visit Number 8    Number of Visits 8    Date for PT Re-Evaluation 06/30/23    Authorization Type [employer]    Authorization Time Period 1/24/-3/5    Authorization - Number of Visits 8    Progress Note Due on Visit 8    PT Start Time 1600    PT Stop Time 1633    PT Time Calculation (min) 33 min    Activity Tolerance Patient tolerated treatment well                Past Medical History:  Diagnosis Date   Arthritis    Gallstones    Helicobacter pylori (H. 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 Yes: NPRS scale: 0; worst 0; best 0; PT RT knee non operated is a 6/10  Pain location: left knee back bothers her as well but we will not be addressing this.  Pain description: feels tight  Aggravating factors: activity  Relieving factors: ice, elevate, Pt non operated knee is at a 6/10  PRECAUTIONS: None    WEIGHT BEARING RESTRICTIONS: No  FALLS:  Has patient fallen in last 6 months? No  LIVING ENVIRONMENT: Lives with: lives with their family Lives in: House/apartment Stairs: Yes: Internal: 14 steps; on right going up Has following equipment at home: Single point cane and Walker - 2 wheeled-does not use   OCCUPATION: retired   PLOF: Independent  PATIENT GOALS: Be able to walk better, less swelling, to have less pain.    NEXT MD VISIT: 05/27/23  OBJECTIVE:  Note: Objective measures were completed at Evaluation unless otherwise noted.  COGNITION: Overall cognitive status: Within functional limits for tasks assessed     SENSATION: WFL  EDEMA:  Noted but normal for surgery   LOWER EXTREMITY ROM:  Active ROM Right eval Left eval 06/04/23  Hip flexion     Hip extension     Hip abduction     Hip adduction  Hip internal rotation     Hip external rotation     Knee flexion 122 112 125  Knee extension 0 1 0  Ankle dorsiflexion     Ankle plantarflexion     Ankle inversion     Ankle eversion      (Blank rows = not tested)  LOWER EXTREMITY MMT:  MMT Right eval 06/29/23 Left eval 06/29/23  Hip flexion 5 5 5 5   Hip extension 3 3/5 2+ 3+/5  Hip abduction 3 4-/5 3 5/5  Hip adduction      Hip internal rotation      Hip external rotation      Knee flexion 5 5 4 5   Knee extension 5 5 5 5   Ankle dorsiflexion 5 5 5 5   Ankle plantarflexion      Ankle inversion      Ankle eversion       (Blank rows = not tested)   FUNCTIONAL TESTS:  30 seconds chair stand test:  13; average  12, good is 35 for age and sex  06/29/23 :  30" sit to stand:  15; 16 is excellent for age and sex  2 minute walk test: 166 ft with trendelenburg gt   06/29/23:  2 minutes walk test:  440 ft no assistive device:  Single leg stance: Rt: 19 , Lt:  20 06/29/23:  single leg stance:  Rt:  25"  , LT:  20"                                                                                                                                 TREATMENT DATE:  06/29/22 Reassess:  see above Sit to stand x 15 Single leg stance x 3 B Walking x 2 minutes Nustep level 3 x 8 minutes. Heel raise x 15  06/25/23 Nustep Level 3 x 7 minutes Single leg stance:  Lt 15 seconds  Heel raise: 15 Mini squat x 5- increased crepitus and pain with RT LE stopped \ LAQ with green theraband x 15 Tandem stance x 5 with head turns B  Side stepping with theraband  x 2 RT  Hip extension with green theraband x 5 each leg repeat 3 x ( limit 5 due to RT knee pain) Lt lunge  x 15 Steps reciprocal x 4 steps   PATIENT EDUCATION:  Education details: Self manual pt has HEP from HH= standing marching, heel raise, toe raise, side steps, sitting LAQ, ankle pumps, sit to stand, hip ab/adduction and walking Person educated: Patient Education method: Explanation, Verbal cues, and Handouts Education comprehension: verbalized understanding and returned demonstration  HOME EXERCISE PROGRAM: pt has HEP from HH= standing marching, heel raise, toe raise, side steps, sitting LAQ, ankle pumps, sit to stand, hip ab/adduction and walking Person educated:  Access Code: [email or web address] URL: [email or web address] Date: 05/19/2023 Prepared by: Virgina Organ  Exercises - Supine Bridge  - 2 x daily - 7 x  weekly - 1 sets - 10 reps - 5 hold 06/25/23 - Side Stepping with Resistance at Thighs  - 1 x daily - 7 x weekly - 1 sets - 10 reps - Standing 3-Way Leg Reach with Resistance at Ankles and Counter Support  - 1 x daily - 7 x weekly - 3 sets - 5  reps - 3-5 seconds  hold ASSESSMENT:  CLINICAL IMPRESSION: PT reassessed.  PT has Improved in all aspects and feels that she is ready for discharge.   OBJECTIVE IMPAIRMENTS: decreased activity tolerance, decreased balance, decreased mobility, difficulty walking, decreased ROM, decreased strength, increased edema, and pain.   ACTIVITY LIMITATIONS: carrying, lifting, bending, sitting, standing, squatting, sleeping, dressing, and locomotion level  PARTICIPATION LIMITATIONS: meal prep, cleaning, laundry, driving, shopping, and community activity   REHAB POTENTIAL: Good  CLINICAL DECISION MAKING: Stable/uncomplicated  EVALUATION COMPLEXITY:  Low   GOALS: Goals reviewed with patient? No  SHORT TERM GOALS: Target date: 2\12/25 Pt to be I in HEP in order to decrease pain to no greater than a 3/10 Baseline: Goal status: met  2.  Pt LT LE mm strength to increase 1/2 grade in order to be able to get into her SUV to drive Baseline:  Goal status:met  3.  Pt to increase her Lt knee ROM to 120 to be able to squat down to get onto the floor Baseline:  Goal status: partial met ; Lt ROM is 125 but unable to squat due to RT LE   4.  Pt to be able to single leg stance for  30   seconds in order to reduce her risk of falls  Baseline:  Goal status: in progress    LONG TERM GOALS: Target date: 06/30/23  Pt to be I in an advanced HEP in order to decrease pain to no greater than a 1/10 Baseline:  Goal status: met  2.  Pt LT LE mm strength to increase 1 grade in order to be able to return from being on the floor in order to play with her grandchildren with ease  Baseline:  Goal status:met   3.  Pt to walk for 10 minutes without difficulty  Goal status: not met due to back  4.  Pt to be able to go up and down 16 steps in a reciprocal manner to get to her bedroom with ease.  Baseline:  Goal status: partially met pt continues to go one step at a time but more out of habit.    PLAN:  PT  FREQUENCY: 2x/week  PT DURATION: 6 weeks  PLANNED INTERVENTIONS: 97110-Therapeutic exercises, 97530- Therapeutic activity, [id number]- Neuromuscular re-education, 97535- Self Care, and 46962- Manual therapy  PLAN FOR NEXT SESSION: discharge   Virgina Organ, PT CLT [phone]  06/29/2023, 4:27 PM

## 2024-02-24 ENCOUNTER — Ambulatory Visit (HOSPITAL_COMMUNITY)

## 2024-02-28 ENCOUNTER — Encounter (HOSPITAL_COMMUNITY): Payer: Self-pay

## 2024-02-28 ENCOUNTER — Ambulatory Visit (HOSPITAL_COMMUNITY): Attending: Sports Medicine

## 2024-02-28 DIAGNOSIS — R600 Localized edema: Secondary | ICD-10-CM | POA: Insufficient documentation

## 2024-02-28 DIAGNOSIS — M25561 Pain in right knee: Secondary | ICD-10-CM | POA: Insufficient documentation

## 2024-02-28 DIAGNOSIS — M25661 Stiffness of right knee, not elsewhere classified: Secondary | ICD-10-CM | POA: Diagnosis present

## 2024-02-28 NOTE — Therapy (Signed)
 OUTPATIENT PHYSICAL THERAPY LOWER EXTREMITY EVALUATION   Patient Name: Kaitlyn Williamson MRN: [id number] DOB:[birth year], 73 y.o., female Today's Date: 02/28/2024  END OF SESSION:  PT End of Session - 02/28/24 1602     Visit Number 1    Number of Visits 12    Date for Recertification  05/26/24    Authorization Type [employer]    Authorization Time Period seeking authorization    Progress Note Due on Visit 10    PT Start Time 1602   Pateint arrived late to her appointment.   PT Stop Time 1633    PT Time Calculation (min) 31 min    Activity Tolerance Patient tolerated treatment well    Behavior During Therapy WFL for tasks assessed/performed          Past Medical History:  Diagnosis Date   Arthritis    Gallstones    Helicobacter pylori (H. pylori) infection    Peptic ulcer    Reflux    Renal disorder    Past Surgical History:  Procedure Laterality Date   INSERTION OF MESH  03/23/2014   Procedure: INSERTION OF MESH;  Surgeon: Oneil DELENA Budge, MD;  Location: [facility];  Service: General;;   LITHOTRIPSY     VENTRAL HERNIA REPAIR  03/23/2014   Procedure: VENTRAL HERNIORRHAPHY;  Surgeon: Oneil DELENA Budge, MD;  Location: [facility];  Service: General;;   Patient Active Problem List   Diagnosis Date Noted   Incarcerated ventral hernia 03/23/2014    PCP: Spencer Ka, MD  REFERRING PROVIDER: Ada Lavelle BROCKS., MD   REFERRING DIAG: Unilateral primary osteoarthritis, right knee   THERAPY DIAG:  Acute pain of right knee  Stiffness of right knee, not elsewhere classified  Localized edema  Rationale for Evaluation and Treatment: Rehabilitation  ONSET DATE: 02/21/24  SUBJECTIVE:   SUBJECTIVE STATEMENT: Patient reports that she had a right knee replacement on 02/21/24. She notes that this knee replacement has been more rough than her last one.  She primarily uses her walker for community ambulation, but she will walk without it in small spaces. She is doing her HEP  about 2-3 times per day.   PERTINENT HISTORY: OA, obesity, and history of cancer PAIN:  Are you having pain? Yes: NPRS scale: Current: 5/10 Best: 5/10 Worst: 10/10 Pain location: right thigh and knee Pain description: cramping and pulling sensation Aggravating factors: trying to lift her leg and straightening her leg Relieving factors: medication   PRECAUTIONS: None  RED FLAGS: None   WEIGHT BEARING RESTRICTIONS: No  FALLS:  Has patient fallen in last 6 months? No  LIVING ENVIRONMENT: Lives with: lives with their family Lives in: House/apartment Stairs: Yes: Internal: 13 steps; on left going up and External: 3 steps; none; step to pattern  Has following equipment at home: Walker - 2 wheeled  OCCUPATION: retired  PLOF: Independent  PATIENT GOALS: reduced pain, improved mobility, play with her grandchildren, and be able to sit in the floor  NEXT MD VISIT: 03/09/24  OBJECTIVE:  Note: Objective measures were completed at Evaluation unless otherwise noted.  PATIENT SURVEYS:  LEFS  Extreme difficulty/unable (0), Quite a bit of difficulty (1), Moderate difficulty (2), Little difficulty (3), No difficulty (4) Survey date:  02/28/24  Any of your usual work, housework or school activities 3  2. Usual hobbies, recreational or sporting activities 0  3. Getting into/out of the bath 4  4. Walking between rooms 3  5. Putting on socks/shoes 1  6. Squatting  0  7. Lifting an object, like a bag of groceries from the floor 3  8. Performing light activities around your home 4  9. Performing heavy activities around your home 3  10. Getting into/out of a car 1  11. Walking 2 blocks 0  12. Walking 1 mile 0  13. Going up/down 10 stairs (1 flight) 0  14. Standing for 1 hour 0  15.  sitting for 1 hour 3  16. Running on even ground 0  17. Running on uneven ground 0  18. Making sharp turns while running fast 0  19. Hopping  0  20. Rolling over in bed 0  Score total:  25/80      COGNITION: Overall cognitive status: Within functional limits for tasks assessed     SENSATION: Patient reports numbness in right knee along her incision and lateral knee  EDEMA:  Unable to accurately assess due to positional limitations  PALPATION: Unable to complete due to time constraints  LOWER EXTREMITY ROM: assessed in sitting  Active ROM Right eval Left eval  Hip flexion    Hip extension    Hip abduction    Hip adduction    Hip internal rotation    Hip external rotation    Knee flexion 66; limited by pulling along incision  129  Knee extension 30 2  Ankle dorsiflexion    Ankle plantarflexion    Ankle inversion    Ankle eversion     (Blank rows = not tested)  LOWER EXTREMITY MMT: not tested due to surgical condition  MMT Right eval Left eval  Hip flexion    Hip extension    Hip abduction    Hip adduction    Hip internal rotation    Hip external rotation    Knee flexion    Knee extension    Ankle dorsiflexion    Ankle plantarflexion    Ankle inversion    Ankle eversion     (Blank rows = not tested)  FUNCTIONAL TESTS:  Timed up and go (TUG): 21.89 seconds with RW  2 minute walk test: to be assessed, as able  GAIT: Assistive device utilized: Environmental Consultant - 2 wheeled Level of assistance: Modified independence Comments: step through pattern with decreased stride length, right knee flexed in stance phase, and limited gait speed                                                                                                                                TREATMENT DATE:   02/28/24: PT evaluation, patient education  PATIENT EDUCATION:  Education details: POC, prognosis, healing, objective findings, and reviewed HEP Person educated: Patient Education method: Explanation Education comprehension: verbalized understanding  HOME EXERCISE PROGRAM:   ASSESSMENT:  CLINICAL IMPRESSION: Patient is a 73 y.o. female who was seen today for physical therapy  evaluation and treatment following a right total knee arthroplasty on 02/21/24. She presented with moderate pain severity and  irritability with right knee AROM aggravating her familiar symptoms. She exhibited increased right knee edema, but this was unable to be measured due to time constraints as she arrived late to her initial evaluation. This will be assessed at her next appointment, as able. She exhibited no signs or symptoms of a post operative complication. Recommend that she continue with skilled physical therapy to address her impairments to return to her prior level of function.     OBJECTIVE IMPAIRMENTS: Abnormal gait, decreased activity tolerance, decreased mobility, difficulty walking, decreased ROM, decreased strength, increased edema, impaired sensation, impaired tone, and pain.   ACTIVITY LIMITATIONS: lifting, standing, squatting, stairs, transfers, bed mobility, dressing, and locomotion level  PARTICIPATION LIMITATIONS: meal prep, cleaning, laundry, driving, shopping, and community activity  PERSONAL FACTORS: Past/current experiences, Transportation, and 3+ comorbidities: OA, obesity, and history of cancer are also affecting patient's functional outcome.   REHAB POTENTIAL: Good  CLINICAL DECISION MAKING: Evolving/moderate complexity  EVALUATION COMPLEXITY: Moderate   GOALS: Goals reviewed with patient? No  SHORT TERM GOALS: Target date: 03/20/24 Patient will be independent with her initial HEP. Baseline: Goal status: INITIAL  2.  Patient will improve her right knee flexion to at least 90 degrees for improved knee mobility. Baseline:  Goal status: INITIAL  3.  Patient will improve her active right knee extension to within 15 degrees of neutral for improved gait mechanics. Baseline:  Goal status: INITIAL  4.  Patient will be able to ambulate at least 200 feet with a cane with a least restrictive assistive device for improved household mobility. Baseline:  Goal status:  INITIAL  LONG TERM GOALS: Target date: 04/10/24  Patient will be independent with her advanced HEP. Baseline:  Goal status: INITIAL  2.  Patient will be able to improve her timed up and go time to 12 seconds or less to reduce her risk of falling. Baseline:  Goal status: INITIAL  3.  Patient will be able to navigate at least 4 steps with a reciprocal pattern for improved household mobility. Baseline:  Goal status: INITIAL  4.  Patient will improve her LEFS score to at least 40/80 for improved perceived function with her daily activities. Baseline:  Goal status: INITIAL  5.  Patient will improve her right knee flexion to at least 120 degrees for improved function navigating stairs. Baseline:  Goal status: INITIAL  6.  Patient will improve her active right knee extension to within 5 degrees of neutral for improved gait mechanics. Baseline:  Goal status: INITIAL   PLAN:  PT FREQUENCY: 2x/week  PT DURATION: 6 weeks  PLANNED INTERVENTIONS: 02835- PT Re-evaluation, 97750- Physical Performance Testing, 97110-Therapeutic exercises, 97530- Therapeutic activity, 97112- Neuromuscular re-education, 97535- Self Care, 02859- Manual therapy, [phone]- Gait training, Patient/Family education, Balance training, Stair training, Joint mobilization, Cryotherapy, and Moist heat  PLAN FOR NEXT SESSION: 2 minute walk test, edema measurements, heel slides, manual therapy, and update HEP   Lacinda JAYSON Fass, PT 02/28/2024, 5:39 PM   [employer] Auth Request Treatment Start Date: 02/28/24  Referring diagnosis code (ICD 10)? M17.11  Treatment diagnosis codes (ICD 10)? (if different than referring diagnosis) M25.561, M25.661, R60.0  What was this (referring dx) caused by? [x]  Surgery []  Fall []  Ongoing issue []  Arthritis []  Other: ____________  Laterality: [x]  Rt []  Lt []  Both  Deficits: [x]  Pain [x]  Stiffness [x]  Weakness [x]  Edema [x]  Balance Deficits []  Coordination [x]  Gait  Disturbance [x]  ROM []  Other   Functional Tool Score: LEFS: 25/80   CPT codes: See  Planned Interventions listed in the Plan section of the Evaluation.

## 2024-03-02 ENCOUNTER — Ambulatory Visit (HOSPITAL_COMMUNITY)

## 2024-03-02 ENCOUNTER — Encounter (HOSPITAL_COMMUNITY): Payer: Self-pay

## 2024-03-02 DIAGNOSIS — M25561 Pain in right knee: Secondary | ICD-10-CM | POA: Diagnosis not present

## 2024-03-02 DIAGNOSIS — R600 Localized edema: Secondary | ICD-10-CM

## 2024-03-02 DIAGNOSIS — M25661 Stiffness of right knee, not elsewhere classified: Secondary | ICD-10-CM

## 2024-03-02 NOTE — Therapy (Signed)
 OUTPATIENT PHYSICAL THERAPY LOWER EXTREMITY EVALUATION   Patient Name: Kaitlyn Williamson MRN: [id number] DOB:05-29-1950, 73 y.o., female Today's Date: 03/02/2024  END OF SESSION:  PT End of Session - 03/02/24 1637     Visit Number 2    Number of Visits 12    Date for Recertification  05/26/24    Authorization Type [employer]    Authorization Time Period 12 visits approved, 02/28/24-05/28/24    Authorization - Visit Number 1    Authorization - Number of Visits 12    Progress Note Due on Visit 10    PT Start Time 1638   late arrival   PT Stop Time 1715    PT Time Calculation (min) 37 min    Activity Tolerance Patient tolerated treatment well;Patient limited by pain    Behavior During Therapy [facility] for tasks assessed/performed           Past Medical History:  Diagnosis Date   Arthritis    Gallstones    Helicobacter pylori (H. pylori) infection    Peptic ulcer    Reflux    Renal disorder    Past Surgical History:  Procedure Laterality Date   INSERTION OF MESH  03/23/2014   Procedure: INSERTION OF MESH;  Surgeon: Oneil DELENA Budge, MD;  Location: [facility];  Service: General;;   LITHOTRIPSY     VENTRAL HERNIA REPAIR  03/23/2014   Procedure: VENTRAL HERNIORRHAPHY;  Surgeon: Oneil DELENA Budge, MD;  Location: [facility];  Service: General;;   Patient Active Problem List   Diagnosis Date Noted   Incarcerated ventral hernia 03/23/2014    PCP: Spencer Ka, MD  REFERRING PROVIDER: Ada Lavelle BROCKS., MD   REFERRING DIAG: Unilateral primary osteoarthritis, right knee   THERAPY DIAG:  Acute pain of right knee  Stiffness of right knee, not elsewhere classified  Localized edema  Rationale for Evaluation and Treatment: Rehabilitation  ONSET DATE: 02/21/24  SUBJECTIVE:   SUBJECTIVE STATEMENT: Pt states it has been a rough day today, pain 6/10. Pt states she has been trying to walk around at home with no AD. Pt reports no falls.  Eval: Patient reports that she had a right  knee replacement on 02/21/24. She notes that this knee replacement has been more rough than her last one.  She primarily uses her walker for community ambulation, but she will walk without it in small spaces. She is doing her HEP about 2-3 times per day.   PERTINENT HISTORY: OA, obesity, and history of cancer PAIN:  Are you having pain? Yes: NPRS scale: Current: 5/10 Best: 5/10 Worst: 10/10 Pain location: right thigh and knee Pain description: cramping and pulling sensation Aggravating factors: trying to lift her leg and straightening her leg Relieving factors: medication   PRECAUTIONS: None  RED FLAGS: None   WEIGHT BEARING RESTRICTIONS: No  FALLS:  Has patient fallen in last 6 months? No  LIVING ENVIRONMENT: Lives with: lives with their family Lives in: House/apartment Stairs: Yes: Internal: 13 steps; on left going up and External: 3 steps; none; step to pattern  Has following equipment at home: Walker - 2 wheeled  OCCUPATION: retired  PLOF: Independent  PATIENT GOALS: reduced pain, improved mobility, play with her grandchildren, and be able to sit in the floor  NEXT MD VISIT: 03/09/24  OBJECTIVE:  Note: Objective measures were completed at Evaluation unless otherwise noted.  PATIENT SURVEYS:  LEFS  Extreme difficulty/unable (0), Quite a bit of difficulty (1), Moderate difficulty (2), Little difficulty (3),  No difficulty (4) Survey date:  02/28/24  Any of your usual work, housework or school activities 3  2. Usual hobbies, recreational or sporting activities 0  3. Getting into/out of the bath 4  4. Walking between rooms 3  5. Putting on socks/shoes 1  6. Squatting  0  7. Lifting an object, like a bag of groceries from the floor 3  8. Performing light activities around your home 4  9. Performing heavy activities around your home 3  10. Getting into/out of a car 1  11. Walking 2 blocks 0  12. Walking 1 mile 0  13. Going up/down 10 stairs (1 flight) 0  14.  Standing for 1 hour 0  15.  sitting for 1 hour 3  16. Running on even ground 0  17. Running on uneven ground 0  18. Making sharp turns while running fast 0  19. Hopping  0  20. Rolling over in bed 0  Score total:  25/80     COGNITION: Overall cognitive status: Within functional limits for tasks assessed     SENSATION: Patient reports numbness in right knee along her incision and lateral knee  EDEMA:  Unable to accurately assess due to positional limitations  PALPATION: Unable to complete due to time constraints  LOWER EXTREMITY ROM: assessed in sitting  Active ROM Right eval Left eval  Hip flexion    Hip extension    Hip abduction    Hip adduction    Hip internal rotation    Hip external rotation    Knee flexion 66; limited by pulling along incision  129  Knee extension 30 2  Ankle dorsiflexion    Ankle plantarflexion    Ankle inversion    Ankle eversion     (Blank rows = not tested)  LOWER EXTREMITY MMT: not tested due to surgical condition  MMT Right eval Left eval  Hip flexion    Hip extension    Hip abduction    Hip adduction    Hip internal rotation    Hip external rotation    Knee flexion    Knee extension    Ankle dorsiflexion    Ankle plantarflexion    Ankle inversion    Ankle eversion     (Blank rows = not tested)  FUNCTIONAL TESTS:  Timed up and go (TUG): 21.89 seconds with RW  2 minute walk test: 327 feet with RW on 03/02/24  GAIT: Assistive device utilized: Environmental Consultant - 2 wheeled Level of assistance: Modified independence Comments: step through pattern with decreased stride length, right knee flexed in stance phase, and limited gait speed                                                                                                                                TREATMENT DATE:   03/02/2024  Therapeutic Exercise: -Supine bridges, 1 sets of 8 reps, 3 second holds, pt cued for max hip  extension -Supine heel slides, 2 sets of 20 reps, pt  cued for increased ROM, strap for OP, 79 degrees of flexion obtained -Quad sets, 2 sets of 10 reps, pt cued with hand towel underneath sx side -Heel and toe raises, 1 set of 15 reps, pt cued for max ROM -Standing calf stretch, 2 reps, BLE, 3 second holds, pt cued for only half of foot on incline Neuromuscular Re-education: -Aeromat walks in parallel bars, tandem and lateral stepping, pt cued for decreased UE support, 1 lap each variation Therapeutic Activity: -Lateral step up and overs, 6 inch step, 1 set of 8 reps bilaterally, pt cued for decreased UE support -Step up and overs, 6 inch step, 1 set of 5 reps, bilaterally, pt cued for only one UE support   02/28/24: PT evaluation, patient education  PATIENT EDUCATION:  Education details: POC, prognosis, healing, objective findings, and reviewed HEP Person educated: Patient Education method: Explanation Education comprehension: verbalized understanding  HOME EXERCISE PROGRAM: Access Code: [email or web address] URL: [email or web address] Date: 03/02/2024 Prepared by: Lang Ada  Exercises - Supine Bridge  - 1 x daily - 7 x weekly - 2 sets - 10 reps - Supine Quad Set  - 1 x daily - 7 x weekly - 3 sets - 10 reps - Supine Heel Slide with Strap  - 1 x daily - 7 x weekly - 3 sets - 10 reps - Heel Raises with Counter Support  - 1 x daily - 7 x weekly - 3 sets - 10 reps  ASSESSMENT:  CLINICAL IMPRESSION: Patient continues to demonstrate increased R knee pain, decreased R knee ROM/strength, decreased gait quality and balance. Patient also demonstrates decreased endurance with aerobic based exercise during today's session on . Patient able to progress dynamic balance and core activation exercises today with step up variations and bridges, good performance with verbal cueing. Patient would continue to benefit from skilled physical therapy for increased endurance with ambulation, increased RLE strength, and improved balance for improved  quality of life, improved independence with gait training and continued progress towards therapy goals.   Eval: Patient is a 73 y.o. 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 She presented with moderate pain severity and irritability with right knee AROM aggravating her familiar symptoms. She exhibited increased right knee edema, but this was unable to be measured due to time constraints as she arrived late to her initial evaluation. This will be assessed at her next appointment, as able. She exhibited no signs or symptoms of a post operative complication. Recommend that she continue with skilled physical therapy to address her impairments to return to her prior level of function.     OBJECTIVE IMPAIRMENTS: Abnormal gait, decreased activity tolerance, decreased mobility, difficulty walking, decreased ROM, decreased strength, increased edema, impaired sensation, impaired tone, and pain.   ACTIVITY LIMITATIONS: lifting, standing, squatting, stairs, transfers, bed mobility, dressing, and locomotion level  PARTICIPATION LIMITATIONS: meal prep, cleaning, laundry, driving, shopping, and community activity  PERSONAL FACTORS: Past/current experiences, Transportation, and 3+ comorbidities: OA, obesity, and history of cancer are also affecting patient's functional outcome.   REHAB POTENTIAL: Good  CLINICAL DECISION MAKING: Evolving/moderate complexity  EVALUATION COMPLEXITY: Moderate   GOALS: Goals reviewed with patient? No  SHORT TERM GOALS: Target date: 03/20/24 Patient will be independent with her initial HEP. Baseline: Goal status: INITIAL  2.  Patient will improve her right knee flexion to at least 90 degrees for improved knee mobility.  Baseline:  Goal status: INITIAL  3.  Patient will improve her active right knee extension to within 15 degrees of neutral for improved gait mechanics. Baseline:  Goal  status: INITIAL  4.  Patient will be able to ambulate at least 200 feet with a cane with a least restrictive assistive device for improved household mobility. Baseline:  Goal status: INITIAL  LONG TERM GOALS: Target date: 04/10/24  Patient will be independent with her advanced HEP. Baseline:  Goal status: INITIAL  2.  Patient will be able to improve her timed up and go time to 12 seconds or less to reduce her risk of falling. Baseline:  Goal status: INITIAL  3.  Patient will be able to navigate at least 4 steps with a reciprocal pattern for improved household mobility. Baseline:  Goal status: INITIAL  4.  Patient will improve her LEFS score to at least 40/80 for improved perceived function with her daily activities. Baseline:  Goal status: INITIAL  5.  Patient will improve her right knee flexion to at least 120 degrees for improved function navigating stairs. Baseline:  Goal status: INITIAL  6.  Patient will improve her active right knee extension to within 5 degrees of neutral for improved gait mechanics. Baseline:  Goal status: INITIAL   PLAN:  PT FREQUENCY: 2x/week  PT DURATION: 6 weeks  PLANNED INTERVENTIONS: 02835- PT Re-evaluation, 97750- Physical Performance Testing, 97110-Therapeutic exercises, 97530- Therapeutic activity, 97112- Neuromuscular re-education, 97535- Self Care, 02859- Manual therapy, [phone]- Gait training, Patient/Family education, Balance training, Stair training, Joint mobilization, Cryotherapy, and Moist heat  PLAN FOR NEXT SESSION: edema measurements, progress R knee ROM and strength   Lang Ada, PT, DPT [facility] Office: [phone] 5:29 PM, 03/02/24

## 2024-03-06 ENCOUNTER — Encounter (HOSPITAL_COMMUNITY)

## 2024-03-09 ENCOUNTER — Encounter (HOSPITAL_COMMUNITY)

## 2024-03-13 ENCOUNTER — Ambulatory Visit (HOSPITAL_COMMUNITY)

## 2024-03-13 ENCOUNTER — Encounter (HOSPITAL_COMMUNITY): Payer: Self-pay

## 2024-03-13 DIAGNOSIS — M25561 Pain in right knee: Secondary | ICD-10-CM

## 2024-03-13 DIAGNOSIS — R600 Localized edema: Secondary | ICD-10-CM

## 2024-03-13 DIAGNOSIS — M25661 Stiffness of right knee, not elsewhere classified: Secondary | ICD-10-CM

## 2024-03-13 NOTE — Therapy (Signed)
 OUTPATIENT PHYSICAL THERAPY LOWER EXTREMITY TREATMENT    Patient Name: Kaitlyn Williamson MRN: [id number] DOB:07-09-1950, 73 y.o., female Today's Date: 03/13/2024  END OF SESSION:  PT End of Session - 03/13/24 1626     Visit Number 3    Number of Visits 12    Date for Recertification  05/26/24    Authorization Type [employer]    Authorization Time Period 12 visits approved, 02/28/24-05/28/24    Authorization - Visit Number 3    Authorization - Number of Visits 12    Progress Note Due on Visit 10    PT Start Time 1626    PT Stop Time 1707    PT Time Calculation (min) 41 min    Activity Tolerance Patient tolerated treatment well    Behavior During Therapy WFL for tasks assessed/performed            Past Medical History:  Diagnosis Date   Arthritis    Gallstones    Helicobacter pylori (H. pylori) infection    Peptic ulcer    Reflux    Renal disorder    Past Surgical History:  Procedure Laterality Date   INSERTION OF MESH  03/23/2014   Procedure: INSERTION OF MESH;  Surgeon: Oneil DELENA Budge, MD;  Location: [facility];  Service: General;;   LITHOTRIPSY     VENTRAL HERNIA REPAIR  03/23/2014   Procedure: VENTRAL HERNIORRHAPHY;  Surgeon: Oneil DELENA Budge, MD;  Location: [facility];  Service: General;;   Patient Active Problem List   Diagnosis Date Noted   Incarcerated ventral hernia 03/23/2014    PCP: Spencer Ka, MD  REFERRING PROVIDER: Ada Lavelle BROCKS., MD   REFERRING DIAG: Unilateral primary osteoarthritis, right knee   THERAPY DIAG:  Acute pain of right knee  Stiffness of right knee, not elsewhere classified  Localized edema  Rationale for Evaluation and Treatment: Rehabilitation  ONSET DATE: 02/21/24  SUBJECTIVE:   SUBJECTIVE STATEMENT: Patient reports that she feels a little better today. She had to have a checked for blood clots at her follow up due to her continued swelling, but everything was clear.    Eval: Patient reports that she had a right  knee replacement on 02/21/24. She notes that this knee replacement has been more rough than her last one.  She primarily uses her walker for community ambulation, but she will walk without it in small spaces. She is doing her HEP about 2-3 times per day.   PERTINENT HISTORY: OA, obesity, and history of cancer PAIN:  Are you having pain? Yes: NPRS scale: Current: 2/10 Best: 2/10 Worst: 10/10 Pain location: right thigh and knee Pain description: cramping and pulling sensation Aggravating factors: trying to lift her leg and straightening her leg Relieving factors: medication   PRECAUTIONS: None  RED FLAGS: None   WEIGHT BEARING RESTRICTIONS: No  FALLS:  Has patient fallen in last 6 months? No  LIVING ENVIRONMENT: Lives with: lives with their family Lives in: House/apartment Stairs: Yes: Internal: 13 steps; on left going up and External: 3 steps; none; step to pattern  Has following equipment at home: Walker - 2 wheeled  OCCUPATION: retired  PLOF: Independent  PATIENT GOALS: reduced pain, improved mobility, play with her grandchildren, and be able to sit in the floor  NEXT MD VISIT: 03/09/24  OBJECTIVE:  Note: Objective measures were completed at Evaluation unless otherwise noted.  PATIENT SURVEYS:  LEFS  Extreme difficulty/unable (0), Quite a bit of difficulty (1), Moderate difficulty (2), Little difficulty (3),  No difficulty (4) Survey date:  02/28/24  Any of your usual work, housework or school activities 3  2. Usual hobbies, recreational or sporting activities 0  3. Getting into/out of the bath 4  4. Walking between rooms 3  5. Putting on socks/shoes 1  6. Squatting  0  7. Lifting an object, like a bag of groceries from the floor 3  8. Performing light activities around your home 4  9. Performing heavy activities around your home 3  10. Getting into/out of a car 1  11. Walking 2 blocks 0  12. Walking 1 mile 0  13. Going up/down 10 stairs (1 flight) 0  14.  Standing for 1 hour 0  15.  sitting for 1 hour 3  16. Running on even ground 0  17. Running on uneven ground 0  18. Making sharp turns while running fast 0  19. Hopping  0  20. Rolling over in bed 0  Score total:  25/80     COGNITION: Overall cognitive status: Within functional limits for tasks assessed     SENSATION: Patient reports numbness in right knee along her incision and lateral knee  EDEMA:  Unable to accurately assess due to positional limitations  PALPATION: Unable to complete due to time constraints  LOWER EXTREMITY ROM: assessed in sitting  Active ROM Right eval Left eval  Hip flexion    Hip extension    Hip abduction    Hip adduction    Hip internal rotation    Hip external rotation    Knee flexion 66; limited by pulling along incision  129  Knee extension 30 2  Ankle dorsiflexion    Ankle plantarflexion    Ankle inversion    Ankle eversion     (Blank rows = not tested)  LOWER EXTREMITY MMT: not tested due to surgical condition  MMT Right eval Left eval  Hip flexion    Hip extension    Hip abduction    Hip adduction    Hip internal rotation    Hip external rotation    Knee flexion    Knee extension    Ankle dorsiflexion    Ankle plantarflexion    Ankle inversion    Ankle eversion     (Blank rows = not tested)  FUNCTIONAL TESTS:  Timed up and go (TUG): 21.89 seconds with RW  2 minute walk test: 327 feet with RW on 03/02/24  GAIT: Assistive device utilized: [employer] - 2 wheeled Level of assistance: Modified independence Comments: step through pattern with decreased stride length, right knee flexed in stance phase, and limited gait speed                                                                                                                                TREATMENT DATE:  03/13/24 EXERCISE LOG  Exercise Repetitions and Resistance Comments  Nustep  L3 x 5 minutes    Standing gastroc stretch   4 x 30 seconds   Rocker board   2.5 minutes  BUE support from parallel bars   LAQ  15 reps  RLE only   Seated HS curl  RTB x 15 reps  RLE only   Lunges onto step  8 step x 15 reps  RLE on step   Step up  6 step x 15 reps each  BUE support from parallel bars   Seated hip ADD isometric   15 reps w/ 5 second hold    Static stance on foam  2.5  minutes    Marching on foam  2.5 minutes  BUE support from parallel bars   Sit to stand  10 reps    Walking  1 lap around gym  No AD    Blank cell = exercise not performed today   03/02/2024  Therapeutic Exercise: -Supine bridges, 1 sets of 8 reps, 3 second holds, pt cued for max hip extension -Supine heel slides, 2 sets of 20 reps, pt cued for increased ROM, strap for OP, 79 degrees of flexion obtained -Quad sets, 2 sets of 10 reps, pt cued with hand towel underneath sx side -Heel and toe raises, 1 set of 15 reps, pt cued for max ROM -Standing calf stretch, 2 reps, BLE, 3 second holds, pt cued for only half of foot on incline Neuromuscular Re-education: -Aeromat walks in parallel bars, tandem and lateral stepping, pt cued for decreased UE support, 1 lap each variation Therapeutic Activity: -Lateral step up and overs, 6 inch step, 1 set of 8 reps bilaterally, pt cued for decreased UE support -Step up and overs, 6 inch step, 1 set of 5 reps, bilaterally, pt cued for only one UE support   02/28/24: PT evaluation, patient education  PATIENT EDUCATION:  Education details: healing Person educated: Patient Education method: Explanation Education comprehension: verbalized understanding  HOME EXERCISE PROGRAM: Access Code: [email or web address] URL: [email or web address] Date: 03/02/2024 Prepared by: Lang Ada  Exercises - Supine Bridge  - 1 x daily - 7 x weekly - 2 sets - 10 reps - Supine Quad Set  - 1 x daily - 7 x weekly - 3 sets - 10 reps - Supine Heel Slide with Strap  - 1 x daily - 7 x weekly - 3 sets - 10 reps - Heel Raises with  Counter Support  - 1 x daily - 7 x weekly - 3 sets - 10 reps  ASSESSMENT:  CLINICAL IMPRESSION: Patient was introduced to multiple new standing interventions for improved lower extremity mobility and stability with moderate difficulty and fatigue. She required minimal cueing throughout treatment for proper pacing to avoid a significant increase in fatigue. However, she experienced a significant increase in fatigue after marching on foam and required a prolonged seated rest break to recover. She reported feeling tired upon the conclusion of treatment. Patient continues to require skilled physical therapy to address her remaining impairments to return to her prior level of function.      Eval: Patient is a 73 y.o. 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 She presented with moderate pain severity and irritability with right knee AROM aggravating her familiar symptoms. She exhibited increased right knee edema, but this was unable to be measured due to time constraints as she arrived late to her  initial evaluation. This will be assessed at her next appointment, as able. She exhibited no signs or symptoms of a post operative complication. Recommend that she continue with skilled physical therapy to address her impairments to return to her prior level of function.     OBJECTIVE IMPAIRMENTS: Abnormal gait, decreased activity tolerance, decreased mobility, difficulty walking, decreased ROM, decreased strength, increased edema, impaired sensation, impaired tone, and pain.   ACTIVITY LIMITATIONS: lifting, standing, squatting, stairs, transfers, bed mobility, dressing, and locomotion level  PARTICIPATION LIMITATIONS: meal prep, cleaning, laundry, driving, shopping, and community activity  PERSONAL FACTORS: Past/current experiences, Transportation, and 3+ comorbidities: OA, obesity, and history of cancer are also affecting  patient's functional outcome.   REHAB POTENTIAL: Good  CLINICAL DECISION MAKING: Evolving/moderate complexity  EVALUATION COMPLEXITY: Moderate   GOALS: Goals reviewed with patient? No  SHORT TERM GOALS: Target date: 03/20/24 Patient will be independent with her initial HEP. Baseline: Goal status: INITIAL  2.  Patient will improve her right knee flexion to at least 90 degrees for improved knee mobility. Baseline:  Goal status: INITIAL  3.  Patient will improve her active right knee extension to within 15 degrees of neutral for improved gait mechanics. Baseline:  Goal status: INITIAL  4.  Patient will be able to ambulate at least 200 feet with a cane with a least restrictive assistive device for improved household mobility. Baseline:  Goal status: INITIAL  LONG TERM GOALS: Target date: 04/10/24  Patient will be independent with her advanced HEP. Baseline:  Goal status: INITIAL  2.  Patient will be able to improve her timed up and go time to 12 seconds or less to reduce her risk of falling. Baseline:  Goal status: INITIAL  3.  Patient will be able to navigate at least 4 steps with a reciprocal pattern for improved household mobility. Baseline:  Goal status: INITIAL  4.  Patient will improve her LEFS score to at least 40/80 for improved perceived function with her daily activities. Baseline:  Goal status: INITIAL  5.  Patient will improve her right knee flexion to at least 120 degrees for improved function navigating stairs. Baseline:  Goal status: INITIAL  6.  Patient will improve her active right knee extension to within 5 degrees of neutral for improved gait mechanics. Baseline:  Goal status: INITIAL   PLAN:  PT FREQUENCY: 2x/week  PT DURATION: 6 weeks  PLANNED INTERVENTIONS: 97164- PT Re-evaluation, 97750- Physical Performance Testing, 97110-Therapeutic exercises, 97530- Therapeutic activity, [id number]- Neuromuscular re-education, 97535- Self Care, [id number]-  Manual therapy, [phone]- Gait training, Patient/Family education, Balance training, Stair training, Joint mobilization, Cryotherapy, and Moist heat  PLAN FOR NEXT SESSION: edema measurements, progress R knee ROM and strength   Lacinda Fass, PT, DPT  5:21 PM, 03/13/24

## 2024-03-16 ENCOUNTER — Encounter (HOSPITAL_COMMUNITY): Payer: Self-pay

## 2024-03-16 ENCOUNTER — Ambulatory Visit (HOSPITAL_COMMUNITY)

## 2024-03-16 DIAGNOSIS — R600 Localized edema: Secondary | ICD-10-CM

## 2024-03-16 DIAGNOSIS — M25661 Stiffness of right knee, not elsewhere classified: Secondary | ICD-10-CM

## 2024-03-16 DIAGNOSIS — M25561 Pain in right knee: Secondary | ICD-10-CM

## 2024-03-16 NOTE — Therapy (Signed)
 OUTPATIENT PHYSICAL THERAPY LOWER EXTREMITY TREATMENT    Patient Name: Kaitlyn Williamson MRN: [id number] DOB:1951/03/02, 73 y.o.,, female Today's Date: 03/16/2024  END OF SESSION:  PT End of Session - 03/16/24 1641     Visit Number 4    Number of Visits 12    Date for Recertification  05/26/24    Authorization Type [employer]    Authorization Time Period 12 visits approved, 02/28/24-05/28/24    Authorization - Visit Number 4    Authorization - Number of Visits 12    Progress Note Due on Visit 10    PT Start Time 1638   Patient arrived late to her appointment.   PT Stop Time 1719    PT Time Calculation (min) 41 min    Activity Tolerance Patient tolerated treatment well    Behavior During Therapy WFL for tasks assessed/performed             Past Medical History:  Diagnosis Date   Arthritis    Gallstones    Helicobacter pylori (H. pylori) infection    Peptic ulcer    Reflux    Renal disorder    Past Surgical History:  Procedure Laterality Date   INSERTION OF MESH  03/23/2014   Procedure: INSERTION OF MESH;  Surgeon: Oneil DELENA Budge, MD;  Location: [facility];  Service: General;;   LITHOTRIPSY     VENTRAL HERNIA REPAIR  03/23/2014   Procedure: VENTRAL HERNIORRHAPHY;  Surgeon: Oneil DELENA Budge, MD;  Location: [facility];  Service: General;;   Patient Active Problem List   Diagnosis Date Noted   Incarcerated ventral hernia 03/23/2014    PCP: Spencer Ka, MD  REFERRING PROVIDER: Ada Lavelle BROCKS., MD   REFERRING DIAG: Unilateral primary osteoarthritis, right knee   THERAPY DIAG:  Acute pain of right knee  Stiffness of right knee, not elsewhere classified  Localized edema  Rationale for Evaluation and Treatment: Rehabilitation  ONSET DATE: 02/21/24  SUBJECTIVE:   SUBJECTIVE STATEMENT: Patient reports that she feels alright today. She was tired after her last appointment. She presented to treatment without an assistive device.   Eval: Patient reports that  she had a right knee replacement on 02/21/24. She notes that this knee replacement has been more rough than her last one.  She primarily uses her walker for community ambulation, but she will walk without it in small spaces. She is doing her HEP about 2-3 times per day.   PERTINENT HISTORY: OA, obesity, and history of cancer PAIN:  Are you having pain? Yes: NPRS scale: Current: 2/10 Best: 2/10 Worst: 10/10 Pain location: right thigh and knee Pain description: cramping and pulling sensation Aggravating factors: trying to lift her leg and straightening her leg Relieving factors: medication   PRECAUTIONS: None  RED FLAGS: None   WEIGHT BEARING RESTRICTIONS: No  FALLS:  Has patient fallen in last 6 months? No  LIVING ENVIRONMENT: Lives with: lives with their family Lives in: House/apartment Stairs: Yes: Internal: 13 steps; on left going up and External: 3 steps; none; step to pattern  Has following equipment at home: Walker - 2 wheeled  OCCUPATION: retired  PLOF: Independent  PATIENT GOALS: reduced pain, improved mobility, play with her grandchildren, and be able to sit in the floor  NEXT MD VISIT: 03/09/24  OBJECTIVE:  Note: Objective measures were completed at Evaluation unless otherwise noted.  PATIENT SURVEYS:  LEFS  Extreme difficulty/unable (0), Quite a bit of difficulty (1), Moderate difficulty (2), Little difficulty (3), No difficulty (  4) Survey date:  02/28/24  Any of your usual work, housework or school activities 3  2. Usual hobbies, recreational or sporting activities 0  3. Getting into/out of the bath 4  4. Walking between rooms 3  5. Putting on socks/shoes 1  6. Squatting  0  7. Lifting an object, like a bag of groceries from the floor 3  8. Performing light activities around your home 4  9. Performing heavy activities around your home 3  10. Getting into/out of a car 1  11. Walking 2 blocks 0  12. Walking 1 mile 0  13. Going up/down 10 stairs (1  flight) 0  14. Standing for 1 hour 0  15.  sitting for 1 hour 3  16. Running on even ground 0  17. Running on uneven ground 0  18. Making sharp turns while running fast 0  19. Hopping  0  20. Rolling over in bed 0  Score total:  25/80     COGNITION: Overall cognitive status: Within functional limits for tasks assessed     SENSATION: Patient reports numbness in right knee along her incision and lateral knee  EDEMA:  Unable to accurately assess due to positional limitations  PALPATION: Unable to complete due to time constraints  LOWER EXTREMITY ROM: assessed in sitting  Active ROM Right eval Left eval  Hip flexion    Hip extension    Hip abduction    Hip adduction    Hip internal rotation    Hip external rotation    Knee flexion 66; limited by pulling along incision  129  Knee extension 30 2  Ankle dorsiflexion    Ankle plantarflexion    Ankle inversion    Ankle eversion     (Blank rows = not tested)  LOWER EXTREMITY MMT: not tested due to surgical condition  MMT Right eval Left eval  Hip flexion    Hip extension    Hip abduction    Hip adduction    Hip internal rotation    Hip external rotation    Knee flexion    Knee extension    Ankle dorsiflexion    Ankle plantarflexion    Ankle inversion    Ankle eversion     (Blank rows = not tested)  FUNCTIONAL TESTS:  Timed up and go (TUG): 21.89 seconds with RW  2 minute walk test: 327 feet with RW on 03/02/24  GAIT: Assistive device utilized: [employer] - 2 wheeled Level of assistance: Modified independence Comments: step through pattern with decreased stride length, right knee flexed in stance phase, and limited gait speed                                                                                                                                TREATMENT DATE:  03/16/24 EXERCISE LOG  Exercise Repetitions and Resistance Comments  Nustep  L3 x 5 minutes   LAQ  2# x  20 reps  RLE only   Seated march  2# x 2 x 10 reps  RLE only   Side stepping  6 laps in parallel bars  Infrequent UE support from parallel bars   Rocker board  2 minutes  BUE support from parallel bars   Step up w/ toe tap  6 step x 10 reps each  1-2 railings  Unilateral carry  5# x 1 lap each    Sit to stand  10 reps    Manual therapy  STM to right quadriceps and scar mobilizations    Blank cell = exercise not performed today                                    03/13/24 EXERCISE LOG  Exercise Repetitions and Resistance Comments  Nustep  L3 x 5 minutes    Standing gastroc stretch  4 x 30 seconds   Rocker board   2.5 minutes  BUE support from parallel bars   LAQ  15 reps  RLE only   Seated HS curl  RTB x 15 reps  RLE only   Lunges onto step  8 step x 15 reps  RLE on step   Step up  6 step x 15 reps each  BUE support from parallel bars   Seated hip ADD isometric   15 reps w/ 5 second hold    Static stance on foam  2.5  minutes    Marching on foam  2.5 minutes  BUE support from parallel bars   Sit to stand  10 reps    Walking  1 lap around gym  No AD    Blank cell = exercise not performed today   03/02/2024  Therapeutic Exercise: -Supine bridges, 1 sets of 8 reps, 3 second holds, pt cued for max hip extension -Supine heel slides, 2 sets of 20 reps, pt cued for increased ROM, strap for OP, 79 degrees of flexion obtained -Quad sets, 2 sets of 10 reps, pt cued with hand towel underneath sx side -Heel and toe raises, 1 set of 15 reps, pt cued for max ROM -Standing calf stretch, 2 reps, BLE, 3 second holds, pt cued for only half of foot on incline Neuromuscular Re-education: -Aeromat walks in parallel bars, tandem and lateral stepping, pt cued for decreased UE support, 1 lap each variation Therapeutic Activity: -Lateral step up and overs, 6 inch step, 1 set of 8 reps bilaterally, pt cued for decreased UE support -Step up and overs, 6 inch step, 1 set of 5 reps, bilaterally, pt cued for  only one UE support   02/28/24: PT evaluation, patient education  PATIENT EDUCATION:  Education details: healing Person educated: Patient Education method: Explanation Education comprehension: verbalized understanding  HOME EXERCISE PROGRAM: Access Code: [email or web address] URL: [email or web address] Date: 03/02/2024 Prepared by: Lang Ada  Exercises - Supine Bridge  - 1 x daily - 7 x weekly - 2 sets - 10 reps - Supine Quad Set  - 1 x daily - 7 x weekly - 3 sets - 10 reps - Supine Heel Slide with Strap  - 1 x daily - 7 x weekly - 3 sets - 10 reps - Heel Raises with Counter Support  - 1 x daily - 7 x weekly - 3  sets - 10 reps  ASSESSMENT:  CLINICAL IMPRESSION: Patient was progressed with familiar interventions for improved lower extremity strength and mobility. She required minimal cueing with step ups for a toe tap onto the second step to facilitate a reciprocal pattern when navigating stairs. She fatigued quickly with today's interventions and required multiple seated rest breaks throughout today's treatment. Manual therapy focused on scar mobilizations and soft tissue mobilization to her quadriceps to facilitate improved soft tissue extensibility. She reported feeling alright upon the conclusion of treatment. Patient continues to require skilled physical therapy to address her remaining impairments to return to her prior level of function.      Eval: Patient is a 73 y.o. 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 She presented with moderate pain severity and irritability with right knee AROM aggravating her familiar symptoms. She exhibited increased right knee edema, but this was unable to be measured due to time constraints as she arrived late to her initial evaluation. This will be assessed at her next appointment, as able. She exhibited no signs or symptoms of a post operative complication.  Recommend that she continue with skilled physical therapy to address her impairments to return to her prior level of function.     OBJECTIVE IMPAIRMENTS: Abnormal gait, decreased activity tolerance, decreased mobility, difficulty walking, decreased ROM, decreased strength, increased edema, impaired sensation, impaired tone, and pain.   ACTIVITY LIMITATIONS: lifting, standing, squatting, stairs, transfers, bed mobility, dressing, and locomotion level  PARTICIPATION LIMITATIONS: meal prep, cleaning, laundry, driving, shopping, and community activity  PERSONAL FACTORS: Past/current experiences, Transportation, and 3+ comorbidities: OA, obesity, and history of cancer are also affecting patient's functional outcome.   REHAB POTENTIAL: Good  CLINICAL DECISION MAKING: Evolving/moderate complexity  EVALUATION COMPLEXITY: Moderate   GOALS: Goals reviewed with patient? No  SHORT TERM GOALS: Target date: 03/20/24 Patient will be independent with her initial HEP. Baseline: Goal status: INITIAL  2.  Patient will improve her right knee flexion to at least 90 degrees for improved knee mobility. Baseline:  Goal status: INITIAL  3.  Patient will improve her active right knee extension to within 15 degrees of neutral for improved gait mechanics. Baseline:  Goal status: INITIAL  4.  Patient will be able to ambulate at least 200 feet with a cane with a least restrictive assistive device for improved household mobility. Baseline:  Goal status: INITIAL  LONG TERM GOALS: Target date: 04/10/24  Patient will be independent with her advanced HEP. Baseline:  Goal status: INITIAL  2.  Patient will be able to improve her timed up and go time to 12 seconds or less to reduce her risk of falling. Baseline:  Goal status: INITIAL  3.  Patient will be able to navigate at least 4 steps with a reciprocal pattern for improved household mobility. Baseline:  Goal status: INITIAL  4.  Patient will improve  her LEFS score to at least 40/80 for improved perceived function with her daily activities. Baseline:  Goal status: INITIAL  5.  Patient will improve her right knee flexion to at least 120 degrees for improved function navigating stairs. Baseline:  Goal status: INITIAL  6.  Patient will improve her active right knee extension to within 5 degrees of neutral for improved gait mechanics. Baseline:  Goal status: INITIAL   PLAN:  PT FREQUENCY: 2x/week  PT DURATION: 6 weeks  PLANNED INTERVENTIONS: 97164- PT Re-evaluation, 97750- Physical Performance Testing, 97110-Therapeutic exercises, 97530-  Therapeutic activity, [id number]- Neuromuscular re-education, [phone]- Self Care, [id number]- Manual therapy, [phone]- Gait training, Patient/Family education, Balance training, Stair training, Joint mobilization, Cryotherapy, and Moist heat  PLAN FOR NEXT SESSION: edema measurements, progress R knee ROM and strength   Lacinda Fass, PT, DPT  5:26 PM, 03/16/24

## 2024-03-20 ENCOUNTER — Ambulatory Visit (HOSPITAL_COMMUNITY)

## 2024-03-20 ENCOUNTER — Encounter (HOSPITAL_COMMUNITY): Payer: Self-pay

## 2024-03-20 DIAGNOSIS — M25561 Pain in right knee: Secondary | ICD-10-CM | POA: Diagnosis not present

## 2024-03-20 DIAGNOSIS — R600 Localized edema: Secondary | ICD-10-CM

## 2024-03-20 DIAGNOSIS — M25661 Stiffness of right knee, not elsewhere classified: Secondary | ICD-10-CM

## 2024-03-20 NOTE — Therapy (Signed)
 OUTPATIENT PHYSICAL THERAPY LOWER EXTREMITY TREATMENT    Patient Name: Kaitlyn Williamson MRN: [id number] DOB:[birth year], 73 y.o., female Today's Date: 03/20/2024  END OF SESSION:  PT End of Session - 03/20/24 1636     Visit Number 5    Number of Visits 12    Date for Recertification  05/26/24    Authorization Type [employer]    Authorization Time Period 12 visits approved, 02/28/24-05/28/24    Authorization - Visit Number 5    Authorization - Number of Visits 12    Progress Note Due on Visit 10    PT Start Time 1636   Patient arrived late to her appointment.   PT Stop Time 1718    PT Time Calculation (min) 42 min    Activity Tolerance Patient tolerated treatment well    Behavior During Therapy WFL for tasks assessed/performed              Past Medical History:  Diagnosis Date   Arthritis    Gallstones    Helicobacter pylori (H. pylori) infection    Peptic ulcer    Reflux    Renal disorder    Past Surgical History:  Procedure Laterality Date   INSERTION OF MESH  03/23/2014   Procedure: INSERTION OF MESH;  Surgeon: Oneil DELENA Budge, MD;  Location: [facility];  Service: General;;   LITHOTRIPSY     VENTRAL HERNIA REPAIR  03/23/2014   Procedure: VENTRAL HERNIORRHAPHY;  Surgeon: Oneil DELENA Budge, MD;  Location: [facility];  Service: General;;   Patient Active Problem List   Diagnosis Date Noted   Incarcerated ventral hernia 03/23/2014    PCP: Spencer Ka, MD  REFERRING PROVIDER: Ada Lavelle BROCKS., MD   REFERRING DIAG: Unilateral primary osteoarthritis, right knee   THERAPY DIAG:  Acute pain of right knee  Stiffness of right knee, not elsewhere classified  Localized edema  Rationale for Evaluation and Treatment: Rehabilitation  ONSET DATE: 02/21/24  SUBJECTIVE:   SUBJECTIVE STATEMENT: Patient reports that she is a little sore today (3-4/10). However, she is not hurting. She is tired from overdoing it this weekend. She felt ok after her last appointment.    Eval: Patient reports that she had a right knee replacement on 02/21/24. She notes that this knee replacement has been more rough than her last one.  She primarily uses her walker for community ambulation, but she will walk without it in small spaces. She is doing her HEP about 2-3 times per day.   PERTINENT HISTORY: OA, obesity, and history of cancer PAIN:  Are you having pain? Yes: NPRS scale: Current: 0/10 Best: 2/10 Worst: 10/10 Pain location: right thigh and knee Pain description: cramping and pulling sensation Aggravating factors: trying to lift her leg and straightening her leg Relieving factors: medication   PRECAUTIONS: None  RED FLAGS: None   WEIGHT BEARING RESTRICTIONS: No  FALLS:  Has patient fallen in last 6 months? No  LIVING ENVIRONMENT: Lives with: lives with their family Lives in: House/apartment Stairs: Yes: Internal: 13 steps; on left going up and External: 3 steps; none; step to pattern  Has following equipment at home: Walker - 2 wheeled  OCCUPATION: retired  PLOF: Independent  PATIENT GOALS: reduced pain, improved mobility, play with her grandchildren, and be able to sit in the floor  NEXT MD VISIT: 03/30/24  OBJECTIVE:  Note: Objective measures were completed at Evaluation unless otherwise noted.  PATIENT SURVEYS:  LEFS  Extreme difficulty/unable (0), Quite a bit of difficulty (  1), Moderate difficulty (2), Little difficulty (3), No difficulty (4) Survey date:  02/28/24  Any of your usual work, housework or school activities 3  2. Usual hobbies, recreational or sporting activities 0  3. Getting into/out of the bath 4  4. Walking between rooms 3  5. Putting on socks/shoes 1  6. Squatting  0  7. Lifting an object, like a bag of groceries from the floor 3  8. Performing light activities around your home 4  9. Performing heavy activities around your home 3  10. Getting into/out of a car 1  11. Walking 2 blocks 0  12. Walking 1 mile 0  13.  Going up/down 10 stairs (1 flight) 0  14. Standing for 1 hour 0  15.  sitting for 1 hour 3  16. Running on even ground 0  17. Running on uneven ground 0  18. Making sharp turns while running fast 0  19. Hopping  0  20. Rolling over in bed 0  Score total:  25/80     COGNITION: Overall cognitive status: Within functional limits for tasks assessed     SENSATION: Patient reports numbness in right knee along her incision and lateral knee  EDEMA:  Unable to accurately assess due to positional limitations  PALPATION: Unable to complete due to time constraints  LOWER EXTREMITY ROM: assessed in sitting  Active ROM Right eval Left eval  Hip flexion    Hip extension    Hip abduction    Hip adduction    Hip internal rotation    Hip external rotation    Knee flexion 66; limited by pulling along incision  129  Knee extension 30 2  Ankle dorsiflexion    Ankle plantarflexion    Ankle inversion    Ankle eversion     (Blank rows = not tested)  LOWER EXTREMITY MMT: not tested due to surgical condition  MMT Right eval Left eval  Hip flexion    Hip extension    Hip abduction    Hip adduction    Hip internal rotation    Hip external rotation    Knee flexion    Knee extension    Ankle dorsiflexion    Ankle plantarflexion    Ankle inversion    Ankle eversion     (Blank rows = not tested)  FUNCTIONAL TESTS:  Timed up and go (TUG): 21.89 seconds with RW  2 minute walk test: 327 feet with RW on 03/02/24  GAIT: Assistive device utilized: [employer] - 2 wheeled Level of assistance: Modified independence Comments: step through pattern with decreased stride length, right knee flexed in stance phase, and limited gait speed                                                                                                                                TREATMENT DATE:  03/20/24 EXERCISE LOG  Exercise Repetitions and Resistance Comments  Recumbent  bike  L1 x 5 minutes  Rocking in available ROM progressing to full revolutions   Rocker board  2 minutes  BUE support from parallel bars   Squatting   15 reps  BUE support from parallel bars   Stepping over hurdles  4 laps in parallel bars  BUE support from parallel bars   Side stepping over hurdles  5 laps in parallel bars  BUE support from parallel bars   Seateed HS curl  GTB x 2 minutes  RLE only   Seated HS stretch  2 minutes  RLE only   LAQ 2# x 20 reps  RLE only   Standing gastroc stretch  4 x 30 seconds     Blank cell = exercise not performed today                                    03/16/24 EXERCISE LOG  Exercise Repetitions and Resistance Comments  Nustep  L3 x 5 minutes   LAQ  2# x 20 reps  RLE only   Seated march  2# x 2 x 10 reps  RLE only   Side stepping  6 laps in parallel bars  Infrequent UE support from parallel bars   Rocker board  2 minutes  BUE support from parallel bars   Step up w/ toe tap  6 step x 10 reps each  1-2 railings  Unilateral carry  5# x 1 lap each    Sit to stand  10 reps    Manual therapy  STM to right quadriceps and scar mobilizations    Blank cell = exercise not performed today                                    03/13/24 EXERCISE LOG  Exercise Repetitions and Resistance Comments  Nustep  L3 x 5 minutes    Standing gastroc stretch  4 x 30 seconds   Rocker board   2.5 minutes  BUE support from parallel bars   LAQ  15 reps  RLE only   Seated HS curl  RTB x 15 reps  RLE only   Lunges onto step  8 step x 15 reps  RLE on step   Step up  6 step x 15 reps each  BUE support from parallel bars   Seated hip ADD isometric   15 reps w/ 5 second hold    Static stance on foam  2.5  minutes    Marching on foam  2.5 minutes  BUE support from parallel bars   Sit to stand  10 reps    Walking  1 lap around gym  No AD    Blank cell = exercise not performed today   03/02/2024  Therapeutic Exercise: -Supine bridges, 1 sets of 8 reps, 3 second holds, pt cued  for max hip extension -Supine heel slides, 2 sets of 20 reps, pt cued for increased ROM, strap for OP, 79 degrees of flexion obtained -Quad sets, 2 sets of 10 reps, pt cued with hand towel underneath sx side -Heel and toe raises, 1 set of 15 reps, pt cued for max ROM -Standing calf stretch, 2 reps, BLE, 3 second holds, pt cued for only half of foot on incline Neuromuscular Re-education: -  Aeromat walks in parallel bars, tandem and lateral stepping, pt cued for decreased UE support, 1 lap each variation Therapeutic Activity: -Lateral step up and overs, 6 inch step, 1 set of 8 reps bilaterally, pt cued for decreased UE support -Step up and overs, 6 inch step, 1 set of 5 reps, bilaterally, pt cued for only one UE support  PATIENT EDUCATION:  Education details: importance of her HEP  Person educated: Patient Education method: Explanation Education comprehension: verbalized understanding  HOME EXERCISE PROGRAM: Access Code: [email or web address] URL: [email or web address] Date: 03/02/2024 Prepared by: Lang Ada  Exercises - Supine Bridge  - 1 x daily - 7 x weekly - 2 sets - 10 reps - Supine Quad Set  - 1 x daily - 7 x weekly - 3 sets - 10 reps - Supine Heel Slide with Strap  - 1 x daily - 7 x weekly - 3 sets - 10 reps - Heel Raises with Counter Support  - 1 x daily - 7 x weekly - 3 sets - 10 reps  ASSESSMENT:  CLINICAL IMPRESSION: Patient presented to treatment reporting increased lower extremity fatigue after overdoing it this weekend.  Today's treatment focused on improved knee mobility with today's new interventions. She required minimal multimodal cueing with the seated hamstring stretch for proper positioning to facilitate improved soft tissue extensibility. Fatigue was her primary limitation with today's interventions as she required brief seated rest breaks throughout treatment. She reported feeling tired upon the conclusion of treatment. Patient continues to require skilled  physical therapy to address her remaining impairments to return to her prior level of function.      Eval: Patient is a 73 y.o. 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 She presented with moderate pain severity and irritability with right knee AROM aggravating her familiar symptoms. She exhibited increased right knee edema, but this was unable to be measured due to time constraints as she arrived late to her initial evaluation. This will be assessed at her next appointment, as able. She exhibited no signs or symptoms of a post operative complication. Recommend that she continue with skilled physical therapy to address her impairments to return to her prior level of function.     OBJECTIVE IMPAIRMENTS: Abnormal gait, decreased activity tolerance, decreased mobility, difficulty walking, decreased ROM, decreased strength, increased edema, impaired sensation, impaired tone, and pain.   ACTIVITY LIMITATIONS: lifting, standing, squatting, stairs, transfers, bed mobility, dressing, and locomotion level  PARTICIPATION LIMITATIONS: meal prep, cleaning, laundry, driving, shopping, and community activity  PERSONAL FACTORS: Past/current experiences, Transportation, and 3+ comorbidities: OA, obesity, and history of cancer are also affecting patient's functional outcome.   REHAB POTENTIAL: Good  CLINICAL DECISION MAKING: Evolving/moderate complexity  EVALUATION COMPLEXITY: Moderate   GOALS: Goals reviewed with patient? No  SHORT TERM GOALS: Target date: 03/20/24 Patient will be independent with her initial HEP. Baseline: Goal status: INITIAL  2.  Patient will improve her right knee flexion to at least 90 degrees for improved knee mobility. Baseline:  Goal status: INITIAL  3.  Patient will improve her active right knee extension to within 15 degrees of neutral for improved gait mechanics. Baseline:  Goal status:  INITIAL  4.  Patient will be able to ambulate at least 200 feet with a cane with a least restrictive assistive device for improved household mobility. Baseline:  Goal status: INITIAL  LONG TERM GOALS: Target date: 04/10/24  Patient will be independent with her  advanced HEP. Baseline:  Goal status: INITIAL  2.  Patient will be able to improve her timed up and go time to 12 seconds or less to reduce her risk of falling. Baseline:  Goal status: INITIAL  3.  Patient will be able to navigate at least 4 steps with a reciprocal pattern for improved household mobility. Baseline:  Goal status: INITIAL  4.  Patient will improve her LEFS score to at least 40/80 for improved perceived function with her daily activities. Baseline:  Goal status: INITIAL  5.  Patient will improve her right knee flexion to at least 120 degrees for improved function navigating stairs. Baseline:  Goal status: INITIAL  6.  Patient will improve her active right knee extension to within 5 degrees of neutral for improved gait mechanics. Baseline:  Goal status: INITIAL   PLAN:  PT FREQUENCY: 2x/week  PT DURATION: 6 weeks  PLANNED INTERVENTIONS: 97164- PT Re-evaluation, 97750- Physical Performance Testing, 97110-Therapeutic exercises, 97530- Therapeutic activity, [id number]- Neuromuscular re-education, 97535- Self Care, [id number]- Manual therapy, [phone]- Gait training, Patient/Family education, Balance training, Stair training, Joint mobilization, Cryotherapy, and Moist heat  PLAN FOR NEXT SESSION: edema measurements, progress R knee ROM and strength   Lacinda Fass, PT, DPT  5:27 PM, 03/20/24

## 2024-03-22 ENCOUNTER — Encounter (HOSPITAL_COMMUNITY)

## 2024-03-27 ENCOUNTER — Ambulatory Visit (HOSPITAL_COMMUNITY)

## 2024-03-27 ENCOUNTER — Encounter (HOSPITAL_COMMUNITY): Payer: Self-pay

## 2024-03-27 DIAGNOSIS — M25561 Pain in right knee: Secondary | ICD-10-CM | POA: Diagnosis present

## 2024-03-27 DIAGNOSIS — R600 Localized edema: Secondary | ICD-10-CM | POA: Diagnosis present

## 2024-03-27 DIAGNOSIS — M25661 Stiffness of right knee, not elsewhere classified: Secondary | ICD-10-CM | POA: Insufficient documentation

## 2024-03-27 NOTE — Therapy (Signed)
 OUTPATIENT PHYSICAL THERAPY LOWER EXTREMITY TREATMENT    Patient Name: Kaitlyn Williamson MRN: [id number] DOB:05-Jul-1950, 73 y.o., female Today's Date: 03/27/2024  END OF SESSION:  PT End of Session - 03/27/24 1557     Visit Number 6    Number of Visits 12    Date for Recertification  05/26/24    Authorization Type [employer]    Authorization Time Period 12 visits approved, 02/28/24-05/28/24    Authorization - Visit Number 6    Authorization - Number of Visits 12    Progress Note Due on Visit 10    PT Start Time 1552   Patient arrived late to her appointment.   PT Stop Time 1626    PT Time Calculation (min) 34 min    Activity Tolerance Patient tolerated treatment well    Behavior During Therapy WFL for tasks assessed/performed               Past Medical History:  Diagnosis Date   Arthritis    Gallstones    Helicobacter pylori (H. pylori) infection    Peptic ulcer    Reflux    Renal disorder    Past Surgical History:  Procedure Laterality Date   INSERTION OF MESH  03/23/2014   Procedure: INSERTION OF MESH;  Surgeon: Oneil DELENA Budge, MD;  Location: [facility];  Service: General;;   LITHOTRIPSY     VENTRAL HERNIA REPAIR  03/23/2014   Procedure: VENTRAL HERNIORRHAPHY;  Surgeon: Oneil DELENA Budge, MD;  Location: [facility];  Service: General;;   Patient Active Problem List   Diagnosis Date Noted   Incarcerated ventral hernia 03/23/2014    PCP: Spencer Ka, MD  REFERRING PROVIDER: Ada Lavelle BROCKS., MD   REFERRING DIAG: Unilateral primary osteoarthritis, right knee   THERAPY DIAG:  Acute pain of right knee  Stiffness of right knee, not elsewhere classified  Localized edema  Rationale for Evaluation and Treatment: Rehabilitation  ONSET DATE: 02/21/24  SUBJECTIVE:   SUBJECTIVE STATEMENT: Patient reports that she is hurting a little today.   Eval: Patient reports that she had a right knee replacement on 02/21/24. She notes that this knee replacement has  been more rough than her last one.  She primarily uses her walker for community ambulation, but she will walk without it in small spaces. She is doing her HEP about 2-3 times per day.   PERTINENT HISTORY: OA, obesity, and history of cancer PAIN:  Are you having pain? Yes: NPRS scale: Current: 2/10 Best: 2/10 Worst: 10/10 Pain location: right thigh and knee Pain description: cramping and pulling sensation Aggravating factors: trying to lift her leg and straightening her leg Relieving factors: medication   PRECAUTIONS: None  RED FLAGS: None   WEIGHT BEARING RESTRICTIONS: No  FALLS:  Has patient fallen in last 6 months? No  LIVING ENVIRONMENT: Lives with: lives with their family Lives in: House/apartment Stairs: Yes: Internal: 13 steps; on left going up and External: 3 steps; none; step to pattern  Has following equipment at home: Walker - 2 wheeled  OCCUPATION: retired  PLOF: Independent  PATIENT GOALS: reduced pain, improved mobility, play with her grandchildren, and be able to sit in the floor  NEXT MD VISIT: 03/30/24  OBJECTIVE:  Note: Objective measures were completed at Evaluation unless otherwise noted.  PATIENT SURVEYS:  LEFS  Extreme difficulty/unable (0), Quite a bit of difficulty (1), Moderate difficulty (2), Little difficulty (3), No difficulty (4) Survey date:  02/28/24  Any of your usual work,  housework or school activities 3  2. Usual hobbies, recreational or sporting activities 0  3. Getting into/out of the bath 4  4. Walking between rooms 3  5. Putting on socks/shoes 1  6. Squatting  0  7. Lifting an object, like a bag of groceries from the floor 3  8. Performing light activities around your home 4  9. Performing heavy activities around your home 3  10. Getting into/out of a car 1  11. Walking 2 blocks 0  12. Walking 1 mile 0  13. Going up/down 10 stairs (1 flight) 0  14. Standing for 1 hour 0  15.  sitting for 1 hour 3  16. Running on even  ground 0  17. Running on uneven ground 0  18. Making sharp turns while running fast 0  19. Hopping  0  20. Rolling over in bed 0  Score total:  25/80     COGNITION: Overall cognitive status: Within functional limits for tasks assessed     SENSATION: Patient reports numbness in right knee along her incision and lateral knee  EDEMA:  Unable to accurately assess due to positional limitations  PALPATION: Unable to complete due to time constraints  LOWER EXTREMITY ROM: assessed in sitting  Active ROM Right eval Left eval  Hip flexion    Hip extension    Hip abduction    Hip adduction    Hip internal rotation    Hip external rotation    Knee flexion 66; limited by pulling along incision  129  Knee extension 30 2  Ankle dorsiflexion    Ankle plantarflexion    Ankle inversion    Ankle eversion     (Blank rows = not tested)  LOWER EXTREMITY MMT: not tested due to surgical condition  MMT Right eval Left eval  Hip flexion    Hip extension    Hip abduction    Hip adduction    Hip internal rotation    Hip external rotation    Knee flexion    Knee extension    Ankle dorsiflexion    Ankle plantarflexion    Ankle inversion    Ankle eversion     (Blank rows = not tested)  FUNCTIONAL TESTS:  Timed up and go (TUG): 21.89 seconds with RW  2 minute walk test: 327 feet with RW on 03/02/24  GAIT: Assistive device utilized: [employer] - 2 wheeled Level of assistance: Modified independence Comments: step through pattern with decreased stride length, right knee flexed in stance phase, and limited gait speed                                                                                                                                TREATMENT DATE:  03/27/24 EXERCISE LOG  Exercise Repetitions and Resistance Comments  Recumbent bike  L3 x 6 minutes    Seated HS stretch    2 minutes  RLE only   LAQ  3# x 20 reps each    Standing march  3# x  20 reps each  BUE support from parallel bars   Standing HS curl  3# x 20 reps each  BUE support from parallel bars   Standing gastroc stretch  4 x 30 seconds     Step up  8 step x 15 reps  Leading with RLE  Lunges onto step  8 step x 20 reps  For improved R knee flexion        Blank cell = exercise not performed today                                    03/20/24 EXERCISE LOG  Exercise Repetitions and Resistance Comments  Recumbent bike  L1 x 5 minutes  Rocking in available ROM progressing to full revolutions   Rocker board  2 minutes  BUE support from parallel bars   Squatting   15 reps  BUE support from parallel bars   Stepping over hurdles  4 laps in parallel bars  BUE support from parallel bars   Side stepping over hurdles  5 laps in parallel bars  BUE support from parallel bars   Seateed HS curl  GTB x 2 minutes  RLE only   Seated HS stretch  2 minutes  RLE only   LAQ 2# x 20 reps  RLE only   Standing gastroc stretch  4 x 30 seconds     Blank cell = exercise not performed today                                    03/16/24 EXERCISE LOG  Exercise Repetitions and Resistance Comments  Nustep  L3 x 5 minutes   LAQ  2# x 20 reps  RLE only   Seated march  2# x 2 x 10 reps  RLE only   Side stepping  6 laps in parallel bars  Infrequent UE support from parallel bars   Rocker board  2 minutes  BUE support from parallel bars   Step up w/ toe tap  6 step x 10 reps each  1-2 railings  Unilateral carry  5# x 1 lap each    Sit to stand  10 reps    Manual therapy  STM to right quadriceps and scar mobilizations    Blank cell = exercise not performed today                                    03/13/24 EXERCISE LOG  Exercise Repetitions and Resistance Comments  Nustep  L3 x 5 minutes    Standing gastroc stretch  4 x 30 seconds   Rocker board   2.5 minutes  BUE support from parallel bars   LAQ  15 reps  RLE only   Seated HS curl  RTB x 15 reps  RLE only   Lunges onto step  8 step x 15 reps   RLE on step   Step up  6 step x 15 reps each  BUE support  from parallel bars   Seated hip ADD isometric   15 reps w/ 5 second hold    Static stance on foam  2.5  minutes    Marching on foam  2.5 minutes  BUE support from parallel bars   Sit to stand  10 reps    Walking  1 lap around gym  No AD    Blank cell = exercise not performed today   PATIENT EDUCATION:  Education details: HEP progression  Person educated: Patient Education method: Explanation Education comprehension: verbalized understanding  HOME EXERCISE PROGRAM: Access Code: [email or web address] URL: [email or web address] Date: 03/02/2024 Prepared by: Lang Ada  Exercises - Supine Bridge  - 1 x daily - 7 x weekly - 2 sets - 10 reps - Supine Quad Set  - 1 x daily - 7 x weekly - 3 sets - 10 reps - Supine Heel Slide with Strap  - 1 x daily - 7 x weekly - 3 sets - 10 reps - Heel Raises with Counter Support  - 1 x daily - 7 x weekly - 3 sets - 10 reps  ASSESSMENT:  CLINICAL IMPRESSION: Patient was progressed with new and familiar interventions for improved mobility with moderate fatigue. She required minimal cueing with today's new interventions for proper exercise performance. She experienced no significant increase in pain or discomfort with any of today's interventions. She reported that her knee felt tired and sore upon the conclusion of treatment. Patient continues to require skilled physical therapy to address her remaining impairments to return to her prior level of function.     Eval: Patient is a 73 y.o. 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 She presented with moderate pain severity and irritability with right knee AROM aggravating her familiar symptoms. She exhibited increased right knee edema, but this was unable to be measured due to time constraints as she arrived late to her initial evaluation. This will be assessed at her  next appointment, as able. She exhibited no signs or symptoms of a post operative complication. Recommend that she continue with skilled physical therapy to address her impairments to return to her prior level of function.     OBJECTIVE IMPAIRMENTS: Abnormal gait, decreased activity tolerance, decreased mobility, difficulty walking, decreased ROM, decreased strength, increased edema, impaired sensation, impaired tone, and pain.   ACTIVITY LIMITATIONS: lifting, standing, squatting, stairs, transfers, bed mobility, dressing, and locomotion level  PARTICIPATION LIMITATIONS: meal prep, cleaning, laundry, driving, shopping, and community activity  PERSONAL FACTORS: Past/current experiences, Transportation, and 3+ comorbidities: OA, obesity, and history of cancer are also affecting patient's functional outcome.   REHAB POTENTIAL: Good  CLINICAL DECISION MAKING: Evolving/moderate complexity  EVALUATION COMPLEXITY: Moderate   GOALS: Goals reviewed with patient? No  SHORT TERM GOALS: Target date: 03/20/24 Patient will be independent with her initial HEP. Baseline: Goal status: INITIAL  2.  Patient will improve her right knee flexion to at least 90 degrees for improved knee mobility. Baseline:  Goal status: INITIAL  3.  Patient will improve her active right knee extension to within 15 degrees of neutral for improved gait mechanics. Baseline:  Goal status: INITIAL  4.  Patient will be able to ambulate at least 200 feet with a cane with a least restrictive assistive device for improved household mobility. Baseline:  Goal status: INITIAL  LONG TERM GOALS: Target date: 04/10/24  Patient will be independent with her advanced HEP. Baseline:  Goal status: INITIAL  2.  Patient will be able to improve her timed up and go time to 12 seconds or less to reduce her risk of falling. Baseline:  Goal status: INITIAL  3.  Patient will be able to navigate at least 4 steps with a reciprocal pattern  for improved household mobility. Baseline:  Goal status: INITIAL  4.  Patient will improve her LEFS score to at least 40/80 for improved perceived function with her daily activities. Baseline:  Goal status: INITIAL  5.  Patient will improve her right knee flexion to at least 120 degrees for improved function navigating stairs. Baseline:  Goal status: INITIAL  6.  Patient will improve her active right knee extension to within 5 degrees of neutral for improved gait mechanics. Baseline:  Goal status: INITIAL   PLAN:  PT FREQUENCY: 2x/week  PT DURATION: 6 weeks  PLANNED INTERVENTIONS: 97164- PT Re-evaluation, 97750- Physical Performance Testing, 97110-Therapeutic exercises, 97530- Therapeutic activity, [id number]- Neuromuscular re-education, 97535- Self Care, [id number]- Manual therapy, [phone]- Gait training, Patient/Family education, Balance training, Stair training, Joint mobilization, Cryotherapy, and Moist heat  PLAN FOR NEXT SESSION: edema measurements, progress R knee ROM and strength   Lacinda Fass, PT, DPT  5:46 PM, 03/27/24

## 2024-03-30 ENCOUNTER — Telehealth (HOSPITAL_COMMUNITY): Payer: Self-pay

## 2024-03-30 ENCOUNTER — Encounter (HOSPITAL_COMMUNITY)

## 2024-03-30 NOTE — Telephone Encounter (Signed)
 No show #1, called concerning missed apt with no answer or voicemail for me to leave message.   Augustin Mclean, LPTA/CLT; WILLAIM [phone]

## 2024-04-03 ENCOUNTER — Encounter (HOSPITAL_COMMUNITY)

## 2024-04-06 ENCOUNTER — Encounter (HOSPITAL_COMMUNITY): Admitting: Physical Therapy

## 2024-04-13 ENCOUNTER — Ambulatory Visit (HOSPITAL_COMMUNITY): Admitting: Physical Therapy

## 2024-04-13 DIAGNOSIS — M25561 Pain in right knee: Secondary | ICD-10-CM | POA: Diagnosis not present

## 2024-04-13 DIAGNOSIS — M25661 Stiffness of right knee, not elsewhere classified: Secondary | ICD-10-CM

## 2024-04-13 DIAGNOSIS — R600 Localized edema: Secondary | ICD-10-CM

## 2024-04-13 NOTE — Therapy (Signed)
 OUTPATIENT PHYSICAL THERAPY LOWER EXTREMITY TREATMENT    Patient Name: Kaitlyn Williamson MRN: [id number] DOB:06/28/1950, 73 y.o., female Today's Date: 04/13/2024  END OF SESSION:  PT End of Session - 04/13/24 0956     Visit Number 7    Number of Visits 12    Date for Recertification  05/26/24    Authorization Type [employer]    Authorization Time Period 12 visits approved, 02/28/24-05/28/24    Authorization - Visit Number 7    Authorization - Number of Visits 12    Progress Note Due on Visit 10    PT Start Time 0956    PT Stop Time 1030    PT Time Calculation (min) 34 min    Activity Tolerance Patient tolerated treatment well    Behavior During Therapy WFL for tasks assessed/performed                Past Medical History:  Diagnosis Date   Arthritis    Gallstones    Helicobacter pylori (H. pylori) infection    Peptic ulcer    Reflux    Renal disorder    Past Surgical History:  Procedure Laterality Date   INSERTION OF MESH  03/23/2014   Procedure: INSERTION OF MESH;  Surgeon: Oneil DELENA Budge, MD;  Location: [facility];  Service: General;;   LITHOTRIPSY     VENTRAL HERNIA REPAIR  03/23/2014   Procedure: VENTRAL HERNIORRHAPHY;  Surgeon: Oneil DELENA Budge, MD;  Location: [facility];  Service: General;;   Patient Active Problem List   Diagnosis Date Noted   Incarcerated ventral hernia 03/23/2014    PCP: Spencer Ka, MD  REFERRING PROVIDER: Ada Lavelle BROCKS., MD   REFERRING DIAG: Unilateral primary osteoarthritis, right knee   THERAPY DIAG:  Acute pain of right knee  Stiffness of right knee, not elsewhere classified  Localized edema  Rationale for Evaluation and Treatment: Rehabilitation  ONSET DATE: 02/21/24  SUBJECTIVE:   SUBJECTIVE STATEMENT: Patient with late arrival today.  States she has had difficulty getting a ride here for therapy and that is why she has not been here the past 3 weeks.  States she doesn't feel like her knee is straightening  like it should.  Reports compliance with HEP.  2/10 pain present in Rt knee.    Eval: Patient reports that she had a right knee replacement on 02/21/24. She notes that this knee replacement has been more rough than her last one.  She primarily uses her walker for community ambulation, but she will walk without it in small spaces. She is doing her HEP about 2-3 times per day.   PERTINENT HISTORY: OA, obesity, and history of cancer PAIN:  Are you having pain? Yes: NPRS scale: Current: 2/10 Best: 2/10 Worst: 10/10 Pain location: right thigh and knee Pain description: cramping and pulling sensation Aggravating factors: trying to lift her leg and straightening her leg Relieving factors: medication   PRECAUTIONS: None  RED FLAGS: None   WEIGHT BEARING RESTRICTIONS: No  FALLS:  Has patient fallen in last 6 months? No  LIVING ENVIRONMENT: Lives with: lives with their family Lives in: House/apartment Stairs: Yes: Internal: 13 steps; on left going up and External: 3 steps; none; step to pattern  Has following equipment at home: Walker - 2 wheeled  OCCUPATION: retired  PLOF: Independent  PATIENT GOALS: reduced pain, improved mobility, play with her grandchildren, and be able to sit in the floor  NEXT MD VISIT: 03/30/24  OBJECTIVE:  Note: Objective measures  were completed at Evaluation unless otherwise noted.  PATIENT SURVEYS:  LEFS  Extreme difficulty/unable (0), Quite a bit of difficulty (1), Moderate difficulty (2), Little difficulty (3), No difficulty (4) Survey date:  02/28/24  Any of your usual work, housework or school activities 3  2. Usual hobbies, recreational or sporting activities 0  3. Getting into/out of the bath 4  4. Walking between rooms 3  5. Putting on socks/shoes 1  6. Squatting  0  7. Lifting an object, like a bag of groceries from the floor 3  8. Performing light activities around your home 4  9. Performing heavy activities around your home 3  10.  Getting into/out of a car 1  11. Walking 2 blocks 0  12. Walking 1 mile 0  13. Going up/down 10 stairs (1 flight) 0  14. Standing for 1 hour 0  15.  sitting for 1 hour 3  16. Running on even ground 0  17. Running on uneven ground 0  18. Making sharp turns while running fast 0  19. Hopping  0  20. Rolling over in bed 0  Score total:  25/80     COGNITION: Overall cognitive status: Within functional limits for tasks assessed     SENSATION: Patient reports numbness in right knee along her incision and lateral knee  EDEMA:  Unable to accurately assess due to positional limitations  PALPATION: Unable to complete due to time constraints  LOWER EXTREMITY ROM: assessed in sitting  Active ROM Right eval Left eval Right 04/13/24  Hip flexion     Hip extension     Hip abduction     Hip adduction     Hip internal rotation     Hip external rotation     Knee flexion 66; limited by pulling along incision  129 115  Knee extension 30 2 0  Ankle dorsiflexion     Ankle plantarflexion     Ankle inversion     Ankle eversion      (Blank rows = not tested)  LOWER EXTREMITY MMT: not tested due to surgical condition  MMT Right eval Left eval  Hip flexion    Hip extension    Hip abduction    Hip adduction    Hip internal rotation    Hip external rotation    Knee flexion    Knee extension    Ankle dorsiflexion    Ankle plantarflexion    Ankle inversion    Ankle eversion     (Blank rows = not tested)  FUNCTIONAL TESTS:  Timed up and go (TUG): 21.89 seconds with RW  2 minute walk test: 327 feet with RW on 03/02/24  GAIT: Assistive device utilized: [employer] - 2 wheeled Level of assistance: Modified independence Comments: step through pattern with decreased stride length, right knee flexed in stance phase, and limited gait speed  TREATMENT DATE:    04/13/24 Bike seat 9 full revolutions 5 minutes level 3 Standing:  knee flexion stretch with 14 step 10X10 holds  hamstring stretch on 14 step 3X30  Slant board stretch 3X30  Lunges onto 4 step 2X10 each side no UE assist AROM                                     03/27/24 EXERCISE LOG  Exercise Repetitions and Resistance Comments  Recumbent bike  L3 x 6 minutes    Seated HS stretch    2 minutes  RLE only   LAQ  3# x 20 reps each    Standing march  3# x 20 reps each  BUE support from parallel bars   Standing HS curl  3# x 20 reps each  BUE support from parallel bars   Standing gastroc stretch  4 x 30 seconds     Step up  8 step x 15 reps  Leading with RLE  Lunges onto step  8 step x 20 reps  For improved R knee flexion        Blank cell = exercise not performed today                                    03/20/24 EXERCISE LOG  Exercise Repetitions and Resistance Comments  Recumbent bike  L1 x 5 minutes  Rocking in available ROM progressing to full revolutions   Rocker board  2 minutes  BUE support from parallel bars   Squatting   15 reps  BUE support from parallel bars   Stepping over hurdles  4 laps in parallel bars  BUE support from parallel bars   Side stepping over hurdles  5 laps in parallel bars  BUE support from parallel bars   Seateed HS curl  GTB x 2 minutes  RLE only   Seated HS stretch  2 minutes  RLE only   LAQ 2# x 20 reps  RLE only   Standing gastroc stretch  4 x 30 seconds     Blank cell = exercise not performed today                                    03/16/24 EXERCISE LOG  Exercise Repetitions and Resistance Comments  Nustep  L3 x 5 minutes   LAQ  2# x 20 reps  RLE only   Seated march  2# x 2 x 10 reps  RLE only   Side stepping  6 laps in parallel bars  Infrequent UE support from parallel bars   Rocker board  2 minutes  BUE support from parallel bars   Step up w/ toe tap  6 step x 10 reps each  1-2 railings  Unilateral carry  5# x 1 lap each    Sit  to stand  10 reps    Manual therapy  STM to right quadriceps and scar mobilizations    Blank cell = exercise not performed today                                    03/13/24 EXERCISE LOG  Exercise Repetitions and Resistance  Comments  Nustep  L3 x 5 minutes    Standing gastroc stretch  4 x 30 seconds   Rocker board   2.5 minutes  BUE support from parallel bars   LAQ  15 reps  RLE only   Seated HS curl  RTB x 15 reps  RLE only   Lunges onto step  8 step x 15 reps  RLE on step   Step up  6 step x 15 reps each  BUE support from parallel bars   Seated hip ADD isometric   15 reps w/ 5 second hold    Static stance on foam  2.5  minutes    Marching on foam  2.5 minutes  BUE support from parallel bars   Sit to stand  10 reps    Walking  1 lap around gym  No AD    Blank cell = exercise not performed today   PATIENT EDUCATION:  Education details: HEP progression  Person educated: Patient Education method: Explanation Education comprehension: verbalized understanding  HOME EXERCISE PROGRAM: Access Code: [email or web address] URL: [email or web address] Date: 03/02/2024 Prepared by: Lang Ada  Exercises - Supine Bridge  - 1 x daily - 7 x weekly - 2 sets - 10 reps - Supine Quad Set  - 1 x daily - 7 x weekly - 3 sets - 10 reps - Supine Heel Slide with Strap  - 1 x daily - 7 x weekly - 3 sets - 10 reps - Heel Raises with Counter Support  - 1 x daily - 7 x weekly - 3 sets - 10 reps  ASSESSMENT:  CLINICAL IMPRESSION: Began session on bike for AROM.  Pt able to make full revolutions and with added resistance this session.  Knee flexion stretch was completed on 14 (2nd step) surface.  Instructed with standing hamstring stretch to help with improving extension and also added slant board to stretch posterior mm. Noted edema in LE's with discussion of wearing compression garments. Pt reports she is not able to wear them.  Pt required several seated rest breaks during session today due to  fatigue.  Overall without complaints of pain or issues during session.  AROM improved today 0-115.  Pt ready for therex progression at this point.    Treatment limited by late arrival today.   Eval: Patient is a 74 y.o. 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 She presented with moderate pain severity and irritability with right knee AROM aggravating her familiar symptoms. She exhibited increased right knee edema, but this was unable to be measured due to time constraints as she arrived late to her initial evaluation. This will be assessed at her next appointment, as able. She exhibited no signs or symptoms of a post operative complication. Recommend that she continue with skilled physical therapy to address her impairments to return to her prior level of function.     OBJECTIVE IMPAIRMENTS: Abnormal gait, decreased activity tolerance, decreased mobility, difficulty walking, decreased ROM, decreased strength, increased edema, impaired sensation, impaired tone, and pain.   ACTIVITY LIMITATIONS: lifting, standing, squatting, stairs, transfers, bed mobility, dressing, and locomotion level  PARTICIPATION LIMITATIONS: meal prep, cleaning, laundry, driving, shopping, and community activity  PERSONAL FACTORS: Past/current experiences, Transportation, and 3+ comorbidities: OA, obesity, and history of cancer are also affecting patient's functional outcome.   REHAB POTENTIAL: Good  CLINICAL DECISION MAKING: Evolving/moderate complexity  EVALUATION COMPLEXITY: Moderate   GOALS: Goals reviewed with patient?  No  SHORT TERM GOALS: Target date: 03/20/24 Patient will be independent with her initial HEP. Baseline: Goal status: INITIAL  2.  Patient will improve her right knee flexion to at least 90 degrees for improved knee mobility. Baseline:  Goal status: INITIAL  3.  Patient will improve her active right knee  extension to within 15 degrees of neutral for improved gait mechanics. Baseline:  Goal status: INITIAL  4.  Patient will be able to ambulate at least 200 feet with a cane with a least restrictive assistive device for improved household mobility. Baseline:  Goal status: INITIAL  LONG TERM GOALS: Target date: 04/10/24  Patient will be independent with her advanced HEP. Baseline:  Goal status: INITIAL  2.  Patient will be able to improve her timed up and go time to 12 seconds or less to reduce her risk of falling. Baseline:  Goal status: INITIAL  3.  Patient will be able to navigate at least 4 steps with a reciprocal pattern for improved household mobility. Baseline:  Goal status: INITIAL  4.  Patient will improve her LEFS score to at least 40/80 for improved perceived function with her daily activities. Baseline:  Goal status: INITIAL  5.  Patient will improve her right knee flexion to at least 120 degrees for improved function navigating stairs. Baseline:  Goal status: INITIAL  6.  Patient will improve her active right knee extension to within 5 degrees of neutral for improved gait mechanics. Baseline:  Goal status: INITIAL   PLAN:  PT FREQUENCY: 2x/week  PT DURATION: 6 weeks  PLANNED INTERVENTIONS: 97164- PT Re-evaluation, 97750- Physical Performance Testing, 97110-Therapeutic exercises, 97530- Therapeutic activity, [id number]- Neuromuscular re-education, 97535- Self Care, [id number]- Manual therapy, [phone]- Gait training, Patient/Family education, Balance training, Stair training, Joint mobilization, Cryotherapy, and Moist heat  PLAN FOR NEXT SESSION: progress LE strength and stabilization.    Greig KATHEE Fuse, PTA/CLT [facility] Ph: [phone] 9:59 AM, 04/13/2024
